# Patient Record
Sex: Female | Born: 1960 | Hispanic: No | Marital: Married | Smoking: Never smoker
Health system: Southern US, Community
[De-identification: ages and names within clinical notes are randomized; demographics above are authoritative.]

## PROBLEM LIST (undated history)

## (undated) DIAGNOSIS — E119 Type 2 diabetes mellitus without complications: Secondary | ICD-10-CM

## (undated) DIAGNOSIS — Z8042 Family history of malignant neoplasm of prostate: Secondary | ICD-10-CM

## (undated) DIAGNOSIS — Z800 Family history of malignant neoplasm of digestive organs: Secondary | ICD-10-CM

## (undated) DIAGNOSIS — Z86018 Personal history of other benign neoplasm: Secondary | ICD-10-CM

## (undated) DIAGNOSIS — G4730 Sleep apnea, unspecified: Secondary | ICD-10-CM

## (undated) DIAGNOSIS — F419 Anxiety disorder, unspecified: Secondary | ICD-10-CM

## (undated) DIAGNOSIS — Z8041 Family history of malignant neoplasm of ovary: Secondary | ICD-10-CM

## (undated) DIAGNOSIS — F32A Depression, unspecified: Secondary | ICD-10-CM

## (undated) HISTORY — DX: Family history of malignant neoplasm of prostate: Z80.42

## (undated) HISTORY — PX: ENDOMETRIAL ABLATION: SHX621

## (undated) HISTORY — DX: Depression, unspecified: F32.A

## (undated) HISTORY — PX: SKIN GRAFT: SHX250

## (undated) HISTORY — PX: TUMOR REMOVAL: SHX12

## (undated) HISTORY — DX: Anxiety disorder, unspecified: F41.9

## (undated) HISTORY — PX: TUBAL LIGATION: SHX77

## (undated) HISTORY — DX: Personal history of other benign neoplasm: Z86.018

## (undated) HISTORY — DX: Sleep apnea, unspecified: G47.30

## (undated) HISTORY — DX: Family history of malignant neoplasm of ovary: Z80.41

## (undated) HISTORY — PX: NASAL SINUS SURGERY: SHX719

## (undated) HISTORY — PX: COLONOSCOPY: SHX174

## (undated) HISTORY — DX: Type 2 diabetes mellitus without complications: E11.9

## (undated) HISTORY — DX: Family history of malignant neoplasm of digestive organs: Z80.0

## (undated) HISTORY — PX: GYNECOLOGIC CRYOSURGERY: SHX857

---

## 2005-02-07 HISTORY — PX: COLONOSCOPY: SHX174

## 2015-01-20 DIAGNOSIS — L905 Scar conditions and fibrosis of skin: Secondary | ICD-10-CM | POA: Insufficient documentation

## 2015-04-20 DIAGNOSIS — R7302 Impaired glucose tolerance (oral): Secondary | ICD-10-CM | POA: Insufficient documentation

## 2015-04-20 DIAGNOSIS — F3340 Major depressive disorder, recurrent, in remission, unspecified: Secondary | ICD-10-CM | POA: Insufficient documentation

## 2015-04-20 DIAGNOSIS — E559 Vitamin D deficiency, unspecified: Secondary | ICD-10-CM | POA: Insufficient documentation

## 2015-04-20 DIAGNOSIS — E782 Mixed hyperlipidemia: Secondary | ICD-10-CM | POA: Insufficient documentation

## 2015-04-21 DIAGNOSIS — Z79899 Other long term (current) drug therapy: Secondary | ICD-10-CM | POA: Insufficient documentation

## 2015-05-05 DIAGNOSIS — M5136 Other intervertebral disc degeneration, lumbar region: Secondary | ICD-10-CM | POA: Insufficient documentation

## 2015-09-18 DIAGNOSIS — F5102 Adjustment insomnia: Secondary | ICD-10-CM | POA: Insufficient documentation

## 2016-05-20 ENCOUNTER — Ambulatory Visit (INDEPENDENT_AMBULATORY_CARE_PROVIDER_SITE_OTHER): Payer: 59

## 2016-05-20 ENCOUNTER — Encounter: Payer: Self-pay | Admitting: Sports Medicine

## 2016-05-20 ENCOUNTER — Ambulatory Visit (INDEPENDENT_AMBULATORY_CARE_PROVIDER_SITE_OTHER): Payer: 59 | Admitting: Sports Medicine

## 2016-05-20 VITALS — BP 114/60 | HR 86 | Resp 18

## 2016-05-20 DIAGNOSIS — M7660 Achilles tendinitis, unspecified leg: Secondary | ICD-10-CM | POA: Diagnosis not present

## 2016-05-20 DIAGNOSIS — M7662 Achilles tendinitis, left leg: Secondary | ICD-10-CM

## 2016-05-20 DIAGNOSIS — R52 Pain, unspecified: Secondary | ICD-10-CM

## 2016-05-20 DIAGNOSIS — M7661 Achilles tendinitis, right leg: Secondary | ICD-10-CM

## 2016-05-20 MED ORDER — METHYLPREDNISOLONE 4 MG PO TBPK: ORAL_TABLET | ORAL | 0 refills | Status: DC

## 2016-05-20 NOTE — Progress Notes (Signed)
Subjective: Nancy Lynch is a 56 y.o. female patient who presents to office for evaluation of right and left heel pain. Patient complains of progressive pain especially over the last year in both heels at the Achilles. Reports pain keeps her up at night, sharp shooting swelling hurts now all the time with inability to sleep. Admits to also problems with her knees, hips and back. Follow orthopedic doctor who requested that she see a foot doctor. Patient has tried tramadol, meloxicam, gabapentin, ibuprofen with no relief in symptoms. Patient denies any other pedal complaints.   There are no active problems to display for this patient.   No current outpatient prescriptions on file prior to visit.   No current facility-administered medications on file prior to visit.     No Known Allergies  Objective:  General: Alert and oriented x3 in no acute distress  Dermatology: No open lesions bilateral lower extremities, no webspace macerations, no ecchymosis bilateral, all nails x 10 are well manicured.  Vascular: Dorsalis Pedis and Posterior Tibial pedal pulses 1/4, Capillary Fill Time 3 seconds, + pedal hair growth bilateral, no edema bilateral lower extremities, Temperature gradient within normal limits.  Neurology: Gross sensation intact via light touch bilateral, Protective sensation intact  with Semmes Weinstein Monofilament to all pedal sites, Position sense intact, vibratory intact bilateral, Deep tendon reflexes within normal limits bilateral, No babinski sign present bilateral. - Tinels sign right/left foot.   Musculoskeletal: Mild tenderness with palpation at insertion of the Achilles Bilateral, there is calcaneal exostosis with mild soft tissue present and decreased ankle rom with knee extending  vs flexed resembling gastroc equnius bilateral, The achilles tendon feels intact with no nodularity or palpable dell, Thompson sign negative, Subtalar and midtarsal joint range of motion is within  normal limits, there is no 1st ray hypermobility or forefoot deformity noted bilateral.   Gait: Antalgic gait with increased heel off right>left.   Xrays  Right and Left Foot    Impression: Normal osseous mineralization. Joint spaces preserved. No fracture/dislocation/boney destruction. Calcaneal spur present. Kager's triangle intact with no obliteration. No soft tissue abnormalities or radiopaque foreign bodies.   Assessment and Plan: Problem List Items Addressed This Visit    None    Visit Diagnoses    Achilles tendonitis, bilateral    -  Primary   Relevant Medications   methylPREDNISolone (MEDROL DOSEPAK) 4 MG TBPK tablet   Pain       Relevant Orders   DG Foot Complete Right   DG Foot Complete Left      -Complete examination performed -Xrays reviewed -Discussed treatement optionsFor insertional Achilles tendinitis -After oral consent and aseptic prep, injected a mixture containing 1 ml of 2%  plain lidocaine, 1 ml 0.5% plain marcaine, 0.5 ml of kenalog 10 and 0.5 ml of dexamethasone phosphate into right and left Achilles insertion without violation of the tendon without complication. Post-injection care discussed with patient.  -Rx Medrol dose pack heel lifts, gentle stretching -Dispensed night splint for right and left foot as instructed -Recommend icing at the end of the day -No improvement will consider MRI/PT/EPAT -Patient to return to office 1 month or sooner if condition worsens.  Asencion Islam, DPM

## 2016-05-20 NOTE — Progress Notes (Signed)
   Subjective:    Patient ID: Nancy Lynch, female    DOB: 13-Nov-1960, 56 y.o.   MRN: [id number]  HPI   I am having some foot pain and they do burn and throb and have a sharp pain at times and sore and tender and hurts on the bottom and up the back and I am a borderline diabetic   Review of Systems  All other systems reviewed and are negative.      Objective:   Physical Exam        Assessment & Plan:

## 2016-05-20 NOTE — Patient Instructions (Signed)
Achilles Tendinitis  Achilles tendinitis is inflammation of the tough, cord-like band that attaches the lower leg muscles to the heel bone (Achilles tendon). This is usually caused by overusing the tendon and the ankle joint.  Achilles tendinitis usually gets better over time with treatment and caring for yourself at home. It can take weeks or months to heal completely.  What are the causes?  This condition may be caused by:  · A sudden increase in exercise or activity, such as running.  · Doing the same exercises or activities (such as jumping) over and over.  · Not warming up calf muscles before exercising.  · Exercising in shoes that are worn out or not made for exercise.  · Having arthritis or a bone growth (spur) on the back of the heel bone. This can rub against the tendon and hurt it.  · Age-related wear and tear. Tendons become less flexible with age and more likely to be injured.    What are the signs or symptoms?  Common symptoms of this condition include:  · Pain in the Achilles tendon or in the back of the leg, just above the heel. The pain usually gets worse with exercise.  · Stiffness or soreness in the back of the leg, especially in the morning.  · Swelling of the skin over the Achilles tendon.  · Thickening of the tendon.  · Bone spurs at the bottom of the Achilles tendon, near the heel.  · Trouble standing on tiptoe.    How is this diagnosed?  This condition is diagnosed based on your symptoms and a physical exam. You may have tests, including:  · X-rays.  · MRI.    How is this treated?  The goal of treatment is to relieve symptoms and help your injury heal. Treatment may include:  · Decreasing or stopping activities that caused the tendinitis. This may mean switching to low-impact exercises like biking or swimming.  · Icing the injured area.  · Doing physical therapy, including strengthening and stretching exercises.  · NSAIDs to help relieve pain and swelling.  · Using supportive shoes, wraps,  heel lifts, or a walking boot (air cast).  · Surgery. This may be done if your symptoms do not improve after 6 months.  · Using high-energy shock wave impulses to stimulate the healing process (extracorporeal shock wave therapy). This is rare.  · Injection of medicines to help relieve inflammation (corticosteroids). This is rare.    Follow these instructions at home:  If you have an air cast:   · Wear the cast as told by your health care provider. Remove it only as told by your health care provider.  · Loosen the cast if your toes tingle, become numb, or turn cold and blue.  Activity   · Gradually return to your normal activities once your health care provider approves. Do not do activities that cause pain.  ? Consider doing low-impact exercises, like cycling or swimming.  · If you have an air cast, ask your health care provider when it is safe for you to drive.  · If physical therapy was prescribed, do exercises as told by your health care provider or physical therapist.  Managing pain, stiffness, and swelling   · Raise (elevate) your foot above the level of your heart while you are sitting or lying down.  · Move your toes often to avoid stiffness and to lessen swelling.  · If directed, put ice on the injured area:  ?   Put ice in a plastic bag.  ? Place a towel between your skin and the bag.  ? Leave the ice on for 20 minutes, 2-3 times a day  General instructions   · If directed, wrap your foot with an elastic bandage or other wrap. This can help keep your tendon from moving too much while it heals. Your health care provider will show you how to wrap your foot correctly.  · Wear supportive shoes or heel lifts only as told by your health care provider.  · Take over-the-counter and prescription medicines only as told by your health care provider.  · Keep all follow-up visits as told by your health care provider. This is important.  Contact a health care provider if:  · You have symptoms that gets worse.  · You have  pain that does not get better with medicine.  · You develop new, unexplained symptoms.  · You develop warmth and swelling in your foot.  · You have a fever.  Get help right away if:  · You have a sudden popping sound or sensation in your Achilles tendon followed by severe pain.  · You cannot move your toes or foot.  · You cannot put any weight on your foot.  Summary  · Achilles tendinitis is inflammation of the tough, cord-like band that attaches the lower leg muscles to the heel bone (Achilles tendon).  · This condition is usually caused by overusing the tendon and the ankle joint. It can also be caused by arthritis or normal aging.  · The most common symptoms of this condition include pain, swelling, or stiffness in the Achilles tendon or in the back of the leg.  · This condition is usually treated with rest, NSAIDs, and physical therapy.  This information is not intended to replace advice given to you by your health care provider. Make sure you discuss any questions you have with your health care provider.  Document Released: 11/03/2004 Document Revised: 12/14/2015 Document Reviewed: 12/14/2015  Elsevier Interactive Patient Education © 2017 Elsevier Inc.

## 2016-06-17 ENCOUNTER — Ambulatory Visit (INDEPENDENT_AMBULATORY_CARE_PROVIDER_SITE_OTHER): Payer: 59 | Admitting: Sports Medicine

## 2016-06-17 DIAGNOSIS — M7661 Achilles tendinitis, right leg: Secondary | ICD-10-CM

## 2016-06-17 DIAGNOSIS — M722 Plantar fascial fibromatosis: Secondary | ICD-10-CM

## 2016-06-17 DIAGNOSIS — B351 Tinea unguium: Secondary | ICD-10-CM | POA: Diagnosis not present

## 2016-06-17 DIAGNOSIS — M7662 Achilles tendinitis, left leg: Secondary | ICD-10-CM

## 2016-06-17 NOTE — Patient Instructions (Signed)
For tennis shoes recommend:  Brooks Beast Ascis New balance Saucony Can be purchased at [employer] or [employer]  Vionic  SAS Can be purchased at [employer] or [employer]   For work shoes recommend: Sketchers Work Timberland boots  Can be purchased at a variety of places or Shoe Market   For casual shoes recommend: Vionic  Can be purchased at [employer] or [employer]  

## 2016-06-18 MED ORDER — TRIAMCINOLONE ACETONIDE 10 MG/ML IJ SUSP: 10.0000 mg | Freq: Once | INTRAMUSCULAR | Status: DC

## 2016-06-18 NOTE — Progress Notes (Signed)
Subjective: Nancy SalkJanice Thumm is a 56 y.o. female patient seen today in office with complaint of painful thickened and discolored left 1st toenail. Patient is desiring treatment for nail changes; has not tried OTC topicals/Medication. Reports that nail is becoming difficult to manage because of the thickness. Patient reports that back of heels is better, injection helped however still feels it a little and now has pain at bottom of heels; Patient has no other pedal complaints at this time.   There are no active problems to display for this patient.   Current Outpatient Prescriptions on File Prior to Visit  Medication Sig Dispense Refill  . albuterol (PROAIR HFA) 108 (90 Base) MCG/ACT inhaler     . atorvastatin (LIPITOR) 40 MG tablet Take 40 mg by mouth.    . Cholecalciferol (D3-50) 50000 units capsule TAKE ONE CAPSULE BY MOUTH EVERY WEEK    . hydrOXYzine (VISTARIL) 25 MG capsule Take 1-2 tablets at night time for sleep.    . methylPREDNISolone (MEDROL DOSEPAK) 4 MG TBPK tablet Take 1st as instructed 21 tablet 0  . minocycline (MINOCIN,DYNACIN) 100 MG capsule     . tiZANidine (ZANAFLEX) 4 MG tablet Take 4 mg by mouth.     No current facility-administered medications on file prior to visit.     No Known Allergies  Objective: Physical Exam  General: Well developed, nourished, no acute distress, awake, alert and oriented x 3  Vascular: Dorsalis pedis artery 1/4 bilateral, Posterior tibial artery 1/4 bilateral, skin temperature warm to warm proximal to distal bilateral lower extremities, no varicosities, pedal hair present bilateral.  Neurological: Gross sensation present via light touch bilateral.   Dermatological: Skin is warm, dry, and supple bilateral, Left 1st toenail is thick, and discolored with mild subungal debris, no webspace macerations present bilateral, no open lesions present bilateral, no callus/corns/hyperkeratotic tissue present bilateral. No signs of infection  bilateral.  Musculoskeletal: Decreased pain with palpation to Achilles bilateral. Increased pain at plantar fascia bilateral. No pain with palpation at left 1st toenail. No pain with calf compression bilateral.  Assessment and Plan:  Problem List Items Addressed This Visit    None    Visit Diagnoses    Nail fungus    -  Primary   Relevant Orders   Fungus Culture with Smear   Achilles tendonitis, bilateral       Plantar fasciitis, bilateral       Relevant Medications   triamcinolone acetonide (KENALOG) 10 MG/ML injection 10 mg      -Examined patient -Discussed continued care for tendonitis and fasciitis -After oral consent and aseptic prep, injected a mixture containing 1 ml of 2%  plain lidocaine, 1 ml 0.5% plain marcaine, 0.5 ml of kenalog 10 and 0.5 ml of dexamethasone phosphate into left and right heel at plantar fascia without complication. Post-injection care discussed with patient.  -Rx topical pain cream from [facility] -Continue with icing, gentle stretching and shoes with heel support/lift -Continue with bilateral night splints  -Discussed treatment options for painful dystrophic nail at left 1st toe  -Fungal culture was obtained and [facility] -Patient to return in 4 weeks for follow up evaluation of tendonitis and fasciitiws and discussion of fungal culture results or sooner if symptoms worsen.  Asencion Islamitorya Indra Wolters, DPM

## 2016-06-20 ENCOUNTER — Telehealth: Payer: Self-pay | Admitting: *Deleted

## 2016-06-20 MED ORDER — NONFORMULARY OR COMPOUNDED ITEM: 2 refills | Status: DC

## 2016-06-20 NOTE — Telephone Encounter (Addendum)
-----   Message from Asencion Islamitorya Stover, [place] sent at 06/17/2016  5:32 PM EDT ----- Regarding: [facility] Topical anti-inflammatory cream for achilles tendonitis  -Dr. Kathie RhodesS. 06/20/2016-[facility].

## 2016-07-20 ENCOUNTER — Encounter: Payer: Self-pay | Admitting: Sports Medicine

## 2016-07-20 ENCOUNTER — Ambulatory Visit (INDEPENDENT_AMBULATORY_CARE_PROVIDER_SITE_OTHER): Payer: 59 | Admitting: Sports Medicine

## 2016-07-20 DIAGNOSIS — L089 Local infection of the skin and subcutaneous tissue, unspecified: Secondary | ICD-10-CM

## 2016-07-20 DIAGNOSIS — L603 Nail dystrophy: Secondary | ICD-10-CM | POA: Diagnosis not present

## 2016-07-20 DIAGNOSIS — A499 Bacterial infection, unspecified: Secondary | ICD-10-CM

## 2016-07-20 DIAGNOSIS — M7661 Achilles tendinitis, right leg: Secondary | ICD-10-CM

## 2016-07-20 DIAGNOSIS — B9689 Other specified bacterial agents as the cause of diseases classified elsewhere: Secondary | ICD-10-CM

## 2016-07-20 DIAGNOSIS — M7662 Achilles tendinitis, left leg: Secondary | ICD-10-CM | POA: Diagnosis not present

## 2016-07-20 DIAGNOSIS — M722 Plantar fascial fibromatosis: Secondary | ICD-10-CM | POA: Diagnosis not present

## 2016-07-20 MED ORDER — NEOMYCIN-POLYMYXIN-HC 3.5-10000-1 OP SUSP: OPHTHALMIC | 3 refills | Status: DC

## 2016-07-20 NOTE — Progress Notes (Signed)
Subjective: Nancy SalkJanice Lynch is a 56 y.o. female patient seen today in office for follow up of bilateral heel pain after injection and for discussion of fungal culture results. Patient states that her job won't allow her to wear tennis shoes without a note/accommodation paperwork completed. Patient also reports that she has started on a new herbal diet to help her lose weight. Patient has no other pedal complaints at this time.   Patient Active Problem List   Diagnosis Date Noted  . Adjustment insomnia 09/18/2015  . Lumbar degenerative disc disease 05/05/2015  . High risk medication use 04/21/2015  . Impaired glucose tolerance 04/20/2015  . Mixed hyperlipidemia 04/20/2015  . Recurrent major depressive disorder, in remission (HCC) 04/20/2015  . Vitamin D deficiency 04/20/2015  . 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 albuterol (PROAIR HFA) 108 (90 Base) MCG/ACT inhaler     . atorvastatin (LIPITOR) 40 MG tablet Take 40 mg by mouth.    . Cholecalciferol (D3-50) 50000 units capsule TAKE ONE CAPSULE BY MOUTH EVERY WEEK    . hydrOXYzine (VISTARIL) 25 MG capsule Take 1-2 tablets at night time for sleep.    . methylPREDNISolone (MEDROL DOSEPAK) 4 MG TBPK tablet Take 1st as instructed 21 tablet 0  . minocycline (MINOCIN,DYNACIN) 100 MG capsule     . NONFORMULARY OR COMPOUNDED ITEM [facility]:  Achilles Tendonitis Cream - Diclofenac 3%, Baclofen 2%, Bupivacaine 1%, Gabapentin 6%, Ibuprofen 3%, Pentoxifylline 3%, apply to affected area 3-4 times daily. 120 each 2  . tiZANidine (ZANAFLEX) 4 MG tablet Take 4 mg by mouth.     Current Facility-Administered Medications on File Prior to Visit  Medication Dose Route Frequency Provider Last Rate Last Dose  . triamcinolone acetonide (KENALOG) 10 MG/ML injection 10 mg  10 mg Other Once Asencion IslamStover, Yeslin Delio, DPM        No Known  Allergies  Objective: Physical Exam  General: Well developed, nourished, no acute distress, awake, alert and oriented x 3  Vascular: Dorsalis pedis artery 1/4 bilateral, Posterior tibial artery 1/4 bilateral, skin temperature warm to warm proximal to distal bilateral lower extremities, no varicosities, pedal hair present bilateral.  Neurological: Gross sensation present via light touch bilateral.   Dermatological: Skin is warm, dry, and supple bilateral, Left 1st toenail is thick, and discolored with mild subungal debris, there is dry blood underneath the right hallux nail, no webspace macerations present bilateral, no open lesions present bilateral, no callus/corns/hyperkeratotic tissue present bilateral. No signs of infection bilateral.  Musculoskeletal: Minimal pain with palpation to Achilles bilateral. Minimal pain at plantar fascia bilateral. No pain with palpation at 1st toenails. No pain with calf compression bilateral.  Assessment and Plan:  Problem List Items Addressed This Visit    None    Visit Diagnoses    Achilles tendonitis, bilateral    -  Primary   Plantar fasciitis, bilateral       Nail dystrophy       Relevant Medications   neomycin-polymyxin-hydrocortisone (CORTISPORIN) 3.5-10000-1 ophthalmic suspension   Bacterial infection of finger or toe       Relevant Medications   neomycin-polymyxin-hydrocortisone (CORTISPORIN) 3.5-10000-1 ophthalmic suspension      -Examined patient -Discussed continued care for tendonitis and fasciitis -No reinjection due to minimal symptoms and minimal additional relief -Continue with topical pain cream from [facility] -Continue with icing, gentle stretching and shoes with heel support/lifts -Continue with bilateral night  splints -Renewed temporary handicap -Completed duty accommodation paperwork for patient to allow tennis shoes at work to prevent a flare up in foot pain; Patient can continue with normal hours with no restrictions   -Discussed treatment options for + culture of subungal bacteria with onycholysis -Rx Cortisporin drops to use to nail -Patient to return in 6-8 weeks for follow up evaluation of tendonitis and fasciitis and nail check or sooner if symptoms worsen.  Asencion Islam, DPM

## 2016-09-02 ENCOUNTER — Ambulatory Visit (INDEPENDENT_AMBULATORY_CARE_PROVIDER_SITE_OTHER): Payer: 59 | Admitting: Sports Medicine

## 2016-09-02 ENCOUNTER — Encounter: Payer: Self-pay | Admitting: Sports Medicine

## 2016-09-02 DIAGNOSIS — R52 Pain, unspecified: Secondary | ICD-10-CM | POA: Diagnosis not present

## 2016-09-02 DIAGNOSIS — M7662 Achilles tendinitis, left leg: Secondary | ICD-10-CM | POA: Diagnosis not present

## 2016-09-02 DIAGNOSIS — M722 Plantar fascial fibromatosis: Secondary | ICD-10-CM | POA: Diagnosis not present

## 2016-09-02 DIAGNOSIS — M7661 Achilles tendinitis, right leg: Secondary | ICD-10-CM

## 2016-09-02 MED ORDER — MELOXICAM 15 MG PO TABS: 15.0000 mg | ORAL_TABLET | Freq: Every day | ORAL | 0 refills | Status: DC

## 2016-09-02 MED ORDER — METHYLPREDNISOLONE 4 MG PO TBPK: ORAL_TABLET | ORAL | 0 refills | Status: DC

## 2016-09-02 NOTE — Patient Instructions (Addendum)
For tennis shoes recommend:  Anne ShutterBrooks Beast Ascis New balance Saucony Can be purchased at [employer] or [employer] or Shoe Market  Vionic  SAS Can be purchased at [employer] or [employer]   For work shoes recommend: Pharmacologistketchers Work Engineer, materialsTimberland boots  Dansko  Can be purchased at a variety of places or Scientist, product/process developmenthoe Market   For casual shoes recommend: Vionic  Dansko Can be purchased at [employer] or [employer]
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
## 2016-09-23 ENCOUNTER — Other Ambulatory Visit: Payer: Self-pay | Admitting: Sports Medicine

## 2016-09-23 NOTE — Progress Notes (Signed)
A fax was received from [facility] for refill on Mobic as requested by patient. I will hold off on refill this medication until she is re-evaluated on 09-30-16 -Dr. Marylene Land

## 2016-09-30 ENCOUNTER — Ambulatory Visit (INDEPENDENT_AMBULATORY_CARE_PROVIDER_SITE_OTHER): Payer: 59 | Admitting: Sports Medicine

## 2016-09-30 DIAGNOSIS — M79671 Pain in right foot: Secondary | ICD-10-CM

## 2016-09-30 DIAGNOSIS — M722 Plantar fascial fibromatosis: Secondary | ICD-10-CM

## 2016-09-30 DIAGNOSIS — M7661 Achilles tendinitis, right leg: Secondary | ICD-10-CM

## 2016-09-30 DIAGNOSIS — M7662 Achilles tendinitis, left leg: Secondary | ICD-10-CM

## 2016-09-30 DIAGNOSIS — M79672 Pain in left foot: Secondary | ICD-10-CM

## 2016-09-30 MED ORDER — TRIAMCINOLONE ACETONIDE 10 MG/ML IJ SUSP: 10.0000 mg | Freq: Once | INTRAMUSCULAR | Status: DC

## 2016-09-30 NOTE — Progress Notes (Signed)
Subjective: Nancy Lynch is a 56 y.o. female patient seen today in office for follow up of bilateral heel pain L>R. States that it feels better but still a little pain on left at end of day. Patient has no other pedal complaints at this time.   Patient Active Problem List   Diagnosis Date Noted  . Adjustment insomnia 09/18/2015  . Lumbar degenerative disc disease 05/05/2015  . High risk medication use 04/21/2015  . Impaired glucose tolerance 04/20/2015  . Mixed hyperlipidemia 04/20/2015  . Recurrent major depressive disorder, in remission (HCC) 04/20/2015  . Vitamin D deficiency 04/20/2015  . 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 albuterol (PROAIR HFA) 108 (90 Base) MCG/ACT inhaler     . atorvastatin (LIPITOR) 40 MG tablet Take 40 mg by mouth.    . Cholecalciferol (D3-50) 50000 units capsule TAKE ONE CAPSULE BY MOUTH EVERY WEEK    . hydrOXYzine (VISTARIL) 25 MG capsule Take 1-2 tablets at night time for sleep.    . meloxicam (MOBIC) 15 MG tablet Take 1 tablet (15 mg total) by mouth daily. 30 tablet 0  . methylPREDNISolone (MEDROL DOSEPAK) 4 MG TBPK tablet Take as instructed 21 tablet 0  . minocycline (MINOCIN,DYNACIN) 100 MG capsule     . neomycin-polymyxin-hydrocortisone (CORTISPORIN) 3.5-10000-1 ophthalmic suspension 1-2 drops to toenail after using nail file each day 7.5 mL 3  . NONFORMULARY OR COMPOUNDED ITEM [facility]:  Achilles Tendonitis Cream - Diclofenac 3%, Baclofen 2%, Bupivacaine 1%, Gabapentin 6%, Ibuprofen 3%, Pentoxifylline 3%, apply to affected area 3-4 times daily. 120 each 2  . tiZANidine (ZANAFLEX) 4 MG tablet Take 4 mg by mouth.     Current Facility-Administered Medications on File Prior to Visit  Medication Dose Route Frequency Provider Last Rate Last Dose  . triamcinolone acetonide (KENALOG) 10 MG/ML injection 10 mg  10 mg Other Once Asencion Islam, DPM         No Known Allergies  Objective: Physical Exam  General: Well developed, nourished, no acute distress, awake, alert and oriented x 3  Vascular: Dorsalis pedis artery 1/4 bilateral, Posterior tibial artery 1/4 bilateral, skin temperature warm to warm proximal to distal bilateral lower extremities, no varicosities, pedal hair present bilateral.  Neurological: Gross sensation present via light touch bilateral.   Dermatological: Skin is warm, dry, and supple bilateral, Left 1st toenail is thick, and discolored with mild subungal debris, there is dry blood underneath the right hallux nail, Both slowly improving, no webspace macerations present bilateral, no open lesions present bilateral, no callus/corns/hyperkeratotic tissue present bilateral. No signs of infection bilateral.  Musculoskeletal: Minimal pain with palpation to Achilles bilateral. Minimal pain at plantar fascia on right, more intense on left. No pain with palpation at 1st toenails. No pain with calf compression bilateral.  Assessment and Plan:  Problem List Items Addressed This Visit    None    Visit Diagnoses    Plantar fasciitis, bilateral    -  Primary   Relevant Medications   triamcinolone acetonide (KENALOG) 10 MG/ML injection 10 mg (Start on 09/30/2016  7:15 PM)   Achilles tendonitis, bilateral       Bilateral foot pain          -Examined patient -Discussed continued care for tendonitis and fasciitis -After oral consent and aseptic prep, injected a mixture containing 1 ml of 2%  plain lidocaine, 1 ml 0.5% plain marcaine, 0.5 ml  of kenalog 10 and 0.5 ml of dexamethasone phosphate into left heel at plantar fascia without complication. Post-injection care discussed with patient.  -Rx [facility] at [facility] in [place] -Continue with topical pain cream from [facility] -Continue with icing, gentle stretching and shoes with heel support/lifts -Continue with bilateral night splints -Continue temporary handicap -Continue  with good supportive shoes -Continue with Cortisporin drops to use to nail -Patient to return in 6 weeks for follow up evaluation of tendonitis and fasciitis or sooner if symptoms worsen.  Asencion Islam, DPM

## 2016-10-11 ENCOUNTER — Ambulatory Visit: Payer: Self-pay

## 2016-10-11 ENCOUNTER — Other Ambulatory Visit: Payer: Self-pay | Admitting: Occupational Medicine

## 2016-10-11 DIAGNOSIS — M79671 Pain in right foot: Secondary | ICD-10-CM

## 2016-10-11 DIAGNOSIS — M25571 Pain in right ankle and joints of right foot: Secondary | ICD-10-CM

## 2016-10-21 ENCOUNTER — Other Ambulatory Visit: Payer: Self-pay | Admitting: Sports Medicine

## 2016-10-21 DIAGNOSIS — M7662 Achilles tendinitis, left leg: Secondary | ICD-10-CM

## 2016-10-21 DIAGNOSIS — R52 Pain, unspecified: Secondary | ICD-10-CM

## 2016-10-21 DIAGNOSIS — M7661 Achilles tendinitis, right leg: Secondary | ICD-10-CM

## 2016-10-21 DIAGNOSIS — M722 Plantar fascial fibromatosis: Secondary | ICD-10-CM

## 2016-10-21 NOTE — Telephone Encounter (Signed)
Pt needs an appt prior to future refills. 

## 2016-10-25 ENCOUNTER — Other Ambulatory Visit: Payer: Self-pay | Admitting: Occupational Medicine

## 2016-10-25 ENCOUNTER — Ambulatory Visit: Payer: Self-pay

## 2016-10-25 DIAGNOSIS — M79671 Pain in right foot: Secondary | ICD-10-CM

## 2016-11-11 ENCOUNTER — Encounter: Payer: Self-pay | Admitting: Sports Medicine

## 2016-11-11 ENCOUNTER — Ambulatory Visit (INDEPENDENT_AMBULATORY_CARE_PROVIDER_SITE_OTHER): Payer: 59 | Admitting: Sports Medicine

## 2016-11-11 DIAGNOSIS — M722 Plantar fascial fibromatosis: Secondary | ICD-10-CM | POA: Diagnosis not present

## 2016-11-11 DIAGNOSIS — M7662 Achilles tendinitis, left leg: Secondary | ICD-10-CM | POA: Diagnosis not present

## 2016-11-11 DIAGNOSIS — M79672 Pain in left foot: Secondary | ICD-10-CM | POA: Diagnosis not present

## 2016-11-11 DIAGNOSIS — M79671 Pain in right foot: Secondary | ICD-10-CM

## 2016-11-11 DIAGNOSIS — M7661 Achilles tendinitis, right leg: Secondary | ICD-10-CM

## 2016-11-11 MED ORDER — MELOXICAM 15 MG PO TABS: 15.0000 mg | ORAL_TABLET | Freq: Every day | ORAL | 0 refills | Status: DC

## 2016-11-11 NOTE — Progress Notes (Signed)
Subjective: Nancy Lynch is a 56 y.o. female patient seen today in office for follow up of bilateral heel pain. States that it feels better. Reports that therapy has been helping. Admits that she did sprain her ankle when walking around the car on 10/11/2016. States that this is getting better. She was given Vicodin, which helped.  Patient has no other pedal complaints at this time.   Patient Active Problem List   Diagnosis Date Noted  . Adjustment insomnia 09/18/2015  . Lumbar degenerative disc disease 05/05/2015  . High risk medication use 04/21/2015  . Impaired glucose tolerance 04/20/2015  . Mixed hyperlipidemia 04/20/2015  . Recurrent major depressive disorder, in remission (HCC) 04/20/2015  . Vitamin D deficiency 04/20/2015  . 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 albuterol (PROAIR HFA) 108 (90 Base) MCG/ACT inhaler     . atorvastatin (LIPITOR) 40 MG tablet Take 40 mg by mouth.    . Cholecalciferol (D3-50) 50000 units capsule TAKE ONE CAPSULE BY MOUTH EVERY WEEK    . hydrOXYzine (VISTARIL) 25 MG capsule Take 1-2 tablets at night time for sleep.    . methylPREDNISolone (MEDROL DOSEPAK) 4 MG TBPK tablet Take as instructed 21 tablet 0  . minocycline (MINOCIN,DYNACIN) 100 MG capsule     . neomycin-polymyxin-hydrocortisone (CORTISPORIN) 3.5-10000-1 ophthalmic suspension 1-2 drops to toenail after using nail file each day 7.5 mL 3  . NONFORMULARY OR COMPOUNDED ITEM [facility]:  Achilles Tendonitis Cream - Diclofenac 3%, Baclofen 2%, Bupivacaine 1%, Gabapentin 6%, Ibuprofen 3%, Pentoxifylline 3%, apply to affected area 3-4 times daily. 120 each 2  . tiZANidine (ZANAFLEX) 4 MG tablet Take 4 mg by mouth.     Current Facility-Administered Medications on File Prior to Visit  Medication Dose Route Frequency Provider Last Rate Last Dose  . triamcinolone acetonide (KENALOG) 10 MG/ML  injection 10 mg  10 mg Other Once [place], Omari Koslosky, DPM      . triamcinolone acetonide (KENALOG) 10 MG/ML injection 10 mg  10 mg Other Once Asencion Islam, DPM        No Known Allergies  Objective: Physical Exam  General: Well developed, nourished, no acute distress, awake, alert and oriented x 3  Vascular: Dorsalis pedis artery 1/4 bilateral, Posterior tibial artery 1/4 bilateral, skin temperature warm to warm proximal to distal bilateral lower extremities, no varicosities, pedal hair present bilateral.  Neurological: Gross sensation present via light touch bilateral.   Dermatological: Skin is warm, dry, and supple bilateral, Left 1st toenail is thick, and discolored with mild subungal debris, there is dry blood underneath the right hallux nail, Both slowly improving, no webspace macerations present bilateral, no open lesions present bilateral, no callus/corns/hyperkeratotic tissue present bilateral. No signs of infection bilateral.  Musculoskeletal: Minimal pain with palpation to Achilles bilateral. Minimal pain at plantar fascia , bilateral. No pain with palpation at 1st toenails. No pain to palpation to right lateral ankle. No pain with calf compression bilateral.  Assessment and Plan:  Problem List Items Addressed This Visit    None    Visit Diagnoses    Plantar fasciitis, bilateral    -  Primary   Relevant Medications   meloxicam (MOBIC) 15 MG tablet   Achilles tendonitis, bilateral       Relevant Medications   meloxicam (MOBIC) 15 MG tablet   Bilateral foot pain       Relevant Medications   meloxicam (MOBIC)  15 MG tablet     -Examined patient -Discussed continued care for tendonitis and fasciitis -Refilled meloxicam  -Continue with physical therapy until completed -Continue with topical pain cream from [facility] -Continue with icing, gentle stretching and shoes with heel support/lifts -Continue with bilateral night splints -Continue temporary handicap -Continue with  good supportive shoes -Continue with Cortisporin drops to use to nail -Patient to return as needed or sooner if symptoms worsen.  Asencion Islam, DPM

## 2017-03-17 ENCOUNTER — Other Ambulatory Visit: Payer: Self-pay | Admitting: Sports Medicine

## 2017-03-17 NOTE — Progress Notes (Signed)
Completed medical duty paperwork for patient. Patient may work normal shift with the allowance of wearing tennis shoes and getting up every hour as needed because of her history of foot pain. A copy of this paperwork was scanned to chart. -Dr. Marylene LandStover

## 2017-05-30 ENCOUNTER — Ambulatory Visit: Payer: Self-pay

## 2017-05-30 ENCOUNTER — Other Ambulatory Visit: Payer: Self-pay | Admitting: Family Medicine

## 2017-05-30 DIAGNOSIS — M25561 Pain in right knee: Secondary | ICD-10-CM

## 2017-06-13 ENCOUNTER — Ambulatory Visit: Payer: Self-pay

## 2017-06-13 ENCOUNTER — Other Ambulatory Visit: Payer: Self-pay | Admitting: Family Medicine

## 2017-06-13 DIAGNOSIS — M25551 Pain in right hip: Secondary | ICD-10-CM

## 2017-06-16 DIAGNOSIS — S99911A Unspecified injury of right ankle, initial encounter: Secondary | ICD-10-CM | POA: Insufficient documentation

## 2017-06-16 DIAGNOSIS — S86011A Strain of right Achilles tendon, initial encounter: Secondary | ICD-10-CM | POA: Insufficient documentation

## 2017-06-16 DIAGNOSIS — M25579 Pain in unspecified ankle and joints of unspecified foot: Secondary | ICD-10-CM | POA: Insufficient documentation

## 2017-07-13 DIAGNOSIS — M545 Low back pain, unspecified: Secondary | ICD-10-CM | POA: Insufficient documentation

## 2017-07-20 DIAGNOSIS — M7661 Achilles tendinitis, right leg: Secondary | ICD-10-CM | POA: Insufficient documentation

## 2017-08-04 ENCOUNTER — Telehealth: Payer: Self-pay | Admitting: Sports Medicine

## 2017-08-04 NOTE — Telephone Encounter (Signed)
Patient needs to get a new handicap sticker she will have her husband Arlis PortaJoseph Tegeler come today to pick it up.

## 2017-08-04 NOTE — Telephone Encounter (Signed)
After approving with Dr Marylene LandStover completed form for patients husband to pick up.

## 2017-08-11 DIAGNOSIS — M79671 Pain in right foot: Secondary | ICD-10-CM | POA: Insufficient documentation

## 2017-11-16 ENCOUNTER — Other Ambulatory Visit: Payer: Self-pay | Admitting: Sports Medicine

## 2017-11-16 DIAGNOSIS — M545 Low back pain, unspecified: Secondary | ICD-10-CM

## 2017-11-22 DIAGNOSIS — M7730 Calcaneal spur, unspecified foot: Secondary | ICD-10-CM | POA: Insufficient documentation

## 2017-11-22 DIAGNOSIS — M6701 Short Achilles tendon (acquired), right ankle: Secondary | ICD-10-CM | POA: Insufficient documentation

## 2017-11-29 ENCOUNTER — Ambulatory Visit
Admission: RE | Admit: 2017-11-29 | Discharge: 2017-11-29 | Disposition: A | Discharge status: Home / Self Care | Payer: Worker's Compensation | Source: Ambulatory Visit | Attending: Sports Medicine | Admitting: Sports Medicine

## 2017-11-29 DIAGNOSIS — M545 Low back pain, unspecified: Secondary | ICD-10-CM

## 2018-02-06 ENCOUNTER — Telehealth: Payer: Self-pay | Admitting: Sports Medicine

## 2018-02-07 NOTE — Telephone Encounter (Signed)
She will have to stop by office front desk for this

## 2018-03-07 ENCOUNTER — Encounter: Payer: Self-pay | Admitting: Obstetrics and Gynecology

## 2018-03-07 ENCOUNTER — Other Ambulatory Visit: Payer: Self-pay

## 2018-03-07 ENCOUNTER — Ambulatory Visit: Payer: BLUE CROSS/BLUE SHIELD | Admitting: Obstetrics and Gynecology

## 2018-03-07 VITALS — BP 138/78 | HR 88 | Ht 64.0 in | Wt 198.0 lb

## 2018-03-07 DIAGNOSIS — Z01419 Encounter for gynecological examination (general) (routine) without abnormal findings: Secondary | ICD-10-CM

## 2018-03-07 DIAGNOSIS — G8929 Other chronic pain: Secondary | ICD-10-CM

## 2018-03-07 DIAGNOSIS — Z124 Encounter for screening for malignant neoplasm of cervix: Secondary | ICD-10-CM

## 2018-03-07 DIAGNOSIS — R3915 Urgency of urination: Secondary | ICD-10-CM | POA: Diagnosis not present

## 2018-03-07 DIAGNOSIS — R350 Frequency of micturition: Secondary | ICD-10-CM | POA: Diagnosis not present

## 2018-03-07 DIAGNOSIS — M545 Low back pain, unspecified: Secondary | ICD-10-CM

## 2018-03-07 DIAGNOSIS — N950 Postmenopausal bleeding: Secondary | ICD-10-CM

## 2018-03-07 DIAGNOSIS — Z8041 Family history of malignant neoplasm of ovary: Secondary | ICD-10-CM

## 2018-03-07 DIAGNOSIS — R1030 Lower abdominal pain, unspecified: Secondary | ICD-10-CM

## 2018-03-07 NOTE — Progress Notes (Signed)
58 y.o. [id number] Married Caucasian female here for annual exam.  She had cryo surgery of her cervix many years ago. She had some brown vaginal discharge in 12/19. She c/o bloating. No bowel changes.  She had 3 rounds of antibiotics for a UTI, last treated in 12/19. Last time she was told she didn't have a UTI.  She continues to have urinary frequency and urgency, no dysuria. Can have slight discomfort externally with voiding.  Urine dip from 1/27: + blood, + WBC Urine culture from 01/07/18 was negative.  She c/o intermittent pain in her lower back for 6 months, worse in the last 1-2 month. Present most of the time in the last week. Pain is up to 8-10/10 in severity. It hurts so bad she can't focus. Sometimes it helps to move, or massage it.  She gets an occasional pain in her lower abdomen (either side). The abdominal pain is an aching pain that can come to go over the course of 30-60 minutes. Up to a 8/10 in severity.     No LMP recorded. Patient has had an ablation.          Sexually active: No.  The current method of family planning is tubal ligation, ablation.    Exercising: No.  The patient does not participate in regular exercise at present. Smoker:  no  Health Maintenance: Pap:  2019 WNL per patient History of abnormal Pap:  Yes, 1983 had cryosurgery MMG:  2019, being followed for something in her left breast BMD:   Never Colonoscopy: 2017 WNL per patient  TDaP:  UTD to patient Gardasil: N/A   reports that she has never smoked. She has never used smokeless tobacco. She reports that she does not drink alcohol or use drugs. She works in [employer], [employer]. 2 kids and one 80 year old grandchild.   Past Medical History:  Diagnosis Date  . History of uterine fibroid     Past Surgical History:  Procedure Laterality Date  . ENDOMETRIAL ABLATION    . GYNECOLOGIC CRYOSURGERY    . NASAL SINUS SURGERY    . SKIN GRAFT    . TUBAL LIGATION    . TUMOR REMOVAL     on thigh, benign per  patient    Current Outpatient Medications  Medication Sig Dispense Refill  . phenazopyridine (PYRIDIUM) 200 MG tablet Take 200 mg by mouth 3 (three) times daily as needed for pain.    Marland Kitchen albuterol (PROAIR HFA) 108 (90 Base) MCG/ACT inhaler      Current Facility-Administered Medications  Medication Dose Route Frequency Provider Last Rate Last Dose  . triamcinolone acetonide (KENALOG) 10 MG/ML injection 10 mg  10 mg Other Once Asencion Islam, DPM      . triamcinolone acetonide (KENALOG) 10 MG/ML injection 10 mg  10 mg Other Once Asencion Islam, DPM        Family History  Problem Relation Age of Onset  . COPD Mother   . Osteoporosis Mother   . Heart attack Father   . Ovarian cancer Sister   . Heart attack Brother   . Colon cancer Paternal Grandfather   Sister died of ovarian cancer at 88.   Review of Systems  Constitutional: Negative.   HENT: Negative.   Eyes: Negative.   Respiratory: Negative.   Cardiovascular: Negative.   Gastrointestinal: Positive for abdominal pain.  Endocrine: Negative.   Genitourinary:       Burning with intercourse  Musculoskeletal: Positive for back pain.  Skin: Negative.  Allergic/Immunologic: Negative.   Neurological: Negative.   Hematological: Negative.   Psychiatric/Behavioral: Negative.     Exam:   BP 138/78 (BP Location: Right Arm, Patient Position: Sitting, Cuff Size: Normal)   Pulse 88   Ht 5\' 4"  (1.626 m)   Wt 198 lb (89.8 kg)   BMI 33.99 kg/m   Weight change: @WEIGHTCHANGE @ Height:   Height: 5\' 4"  (162.6 cm)  Ht Readings from Last 3 Encounters:  03/07/18 5\' 4"  (1.626 m)    General appearance: alert, cooperative and appears stated age Head: Normocephalic, without obvious abnormality, atraumatic Neck: no adenopathy, supple, symmetrical, trachea midline and thyroid normal to inspection and palpation Lungs: clear to auscultation bilaterally Cardiovascular: regular rate and rhythm Breasts: normal appearance, no masses or  tenderness Abdomen: soft, non-tender; non distended,  no masses,  no organomegaly Extremities: extremities normal, atraumatic, no cyanosis or edema Skin: Skin color, texture, turgor normal. No rashes or lesions Lymph nodes: Cervical, supraclavicular, and axillary nodes normal. No abnormal inguinal nodes palpated Neurologic: Grossly normal   Pelvic: External genitalia:  no lesions              Urethra:  normal appearing urethra with no masses, tenderness or lesions              Bartholins and Skenes: normal                 Vagina: normal appearing vagina with normal color and discharge, no lesions              Cervix: no cervical motion tenderness and no lesions               Bimanual Exam:  Uterus:  normal size, contour, position, consistency, mobility, non-tender and anteverted              Adnexa: no mass, fullness, tenderness               Rectovaginal: Confirms               Anus:  normal sphincter tone, no lesions  Bladder: not tender  Pelvic floor: not tender  Chaperone was present for exam.  A:  Well Woman with normal exam  Severe lower/mid back pain, suspect MS pain  PMP spotting  Urinary frequency and urgency  Intermittent abdominal pain  Bloating   FH of ovarian cancer, sister died at 32, didn't have genetic testing  P:   Pap with hpv  Return for gyn ultrasound  Send urine for Ua, C&S  Discussed breast self exam  Discussed calcium and vit D intake  She will do her labs with her primary  Discussed PT for her back, information on back pain given  Discussed the option of seeing a genetic counselor

## 2018-03-07 NOTE — Patient Instructions (Addendum)
EXERCISE AND DIET:  We recommended that you start or continue a regular exercise program for good health. Regular exercise means any activity that makes your heart beat faster and makes you sweat.  We recommend exercising at least 30 minutes per day at least 3 days a week, preferably 4 or 5.  We also recommend a diet low in fat and sugar.  Inactivity, poor dietary choices and obesity can cause diabetes, heart attack, stroke, and kidney damage, among others.    ALCOHOL AND SMOKING:  Women should limit their alcohol intake to no more than 7 drinks/beers/glasses of wine (combined, not each!) per week. Moderation of alcohol intake to this level decreases your risk of breast cancer and liver damage. And of course, no recreational drugs are part of a healthy lifestyle.  And absolutely no smoking or even second hand smoke. Most people know smoking can cause heart and lung diseases, but did you know it also contributes to weakening of your bones? Aging of your skin?  Yellowing of your teeth and nails?  CALCIUM AND VITAMIN D:  Adequate intake of calcium and Vitamin D are recommended.  The recommendations for exact amounts of these supplements seem to change often, but generally speaking 1,200 mg of calcium (between diet and supplement) and 800 units of Vitamin D per day seems prudent. Certain women may benefit from higher intake of Vitamin D.  If you are among these women, your doctor will have told you during your visit.    PAP SMEARS:  Pap smears, to check for cervical cancer or precancers,  have traditionally been done yearly, although recent scientific advances have shown that most women can have pap smears less often.  However, every woman still should have a physical exam from her gynecologist every year. It will include a breast check, inspection of the vulva and vagina to check for abnormal growths or skin changes, a visual exam of the cervix, and then an exam to evaluate the size and shape of the uterus and  ovaries.  And after 57 years of age, a rectal exam is indicated to check for rectal cancers. We will also provide age appropriate advice regarding health maintenance, like when you should have certain vaccines, screening for sexually transmitted diseases, bone density testing, colonoscopy, mammograms, etc.   MAMMOGRAMS:  All women over 40 years old should have a yearly mammogram. Many facilities now offer a "3D" mammogram, which may cost around $50 extra out of pocket. If possible,  we recommend you accept the option to have the 3D mammogram performed.  It both reduces the number of women who will be called back for extra views which then turn out to be normal, and it is better than the routine mammogram at detecting truly abnormal areas.    COLON CANCER SCREENING: Now recommend starting at age 45. At this time colonoscopy is not covered for routine screening until 50. There are take home tests that can be done between 45-49.   COLONOSCOPY:  Colonoscopy to screen for colon cancer is recommended for all women at age 50.  We know, you hate the idea of the prep.  We agree, BUT, having colon cancer and not knowing it is worse!!  Colon cancer so often starts as a polyp that can be seen and removed at colonscopy, which can quite literally save your life!  And if your first colonoscopy is normal and you have no family history of colon cancer, most women don't have to have it again for   10 years.  Once every ten years, you can do something that may end up saving your life, right?  We will be happy to help you get it scheduled when you are ready.  Be sure to check your insurance coverage so you understand how much it will cost.  It may be covered as a preventative service at no cost, but you should check your particular policy.      Breast Self-Awareness Breast self-awareness means being familiar with how your breasts look and feel. It involves checking your breasts regularly and reporting any changes to your  health care provider. Practicing breast self-awareness is important. A change in your breasts can be a sign of a serious medical problem. Being familiar with how your breasts look and feel allows you to find any problems early, when treatment is more likely to be successful. All women should practice breast self-awareness, including women who have had breast implants. How to do a breast self-exam One way to learn what is normal for your breasts and whether your breasts are changing is to do a breast self-exam. To do a breast self-exam: Look for Changes  1. Remove all the clothing above your waist. 2. Stand in front of a mirror in a room with good lighting. 3. Put your hands on your hips. 4. Push your hands firmly downward. 5. Compare your breasts in the mirror. Look for differences between them (asymmetry), such as: ? Differences in shape. ? Differences in size. ? Puckers, dips, and bumps in one breast and not the other. 6. Look at each breast for changes in your skin, such as: ? Redness. ? Scaly areas. 7. Look for changes in your nipples, such as: ? Discharge. ? Bleeding. ? Dimpling. ? Redness. ? A change in position. Feel for Changes Carefully feel your breasts for lumps and changes. It is best to do this while lying on your back on the floor and again while sitting or standing in the shower or tub with soapy water on your skin. Feel each breast in the following way:  Place the arm on the side of the breast you are examining above your head.  Feel your breast with the other hand.  Start in the nipple area and make  inch (2 cm) overlapping circles to feel your breast. Use the pads of your three middle fingers to do this. Apply light pressure, then medium pressure, then firm pressure. The light pressure will allow you to feel the tissue closest to the skin. The medium pressure will allow you to feel the tissue that is a little deeper. The firm pressure will allow you to feel the tissue  close to the ribs.  Continue the overlapping circles, moving downward over the breast until you feel your ribs below your breast.  Move one finger-width toward the center of the body. Continue to use the  inch (2 cm) overlapping circles to feel your breast as you move slowly up toward your collarbone.  Continue the up and down exam using all three pressures until you reach your armpit.  Write Down What You Find  Write down what is normal for each breast and any changes that you find. Keep a written record with breast changes or normal findings for each breast. By writing this information down, you do not need to depend only on memory for size, tenderness, or location. Write down where you are in your menstrual cycle, if you are still menstruating. If you are having trouble noticing differences   in your breasts, do not get discouraged. With time you will become more familiar with the variations in your breasts and more comfortable with the exam. How often should I examine my breasts? Examine your breasts every month. If you are breastfeeding, the best time to examine your breasts is after a feeding or after using a breast pump. If you menstruate, the best time to examine your breasts is 5-7 days after your period is over. During your period, your breasts are lumpier, and it may be more difficult to notice changes. When should I see my health care provider? See your health care provider if you notice:  A change in shape or size of your breasts or nipples.  A change in the skin of your breast or nipples, such as a reddened or scaly area.  Unusual discharge from your nipples.  A lump or thick area that was not there before.  Pain in your breasts.  Anything that concerns you.   Acute Back Pain, Adult Acute back pain is sudden and usually short-lived. It is often caused by an injury to the muscles and tissues in the back. The injury may result from:  A muscle or ligament getting  overstretched or torn (strained). Ligaments are tissues that connect bones to each other. Lifting something improperly can cause a back strain.  Wear and tear (degeneration) of the spinal disks. Spinal disks are circular tissue that provides cushioning between the bones of the spine (vertebrae).  Twisting motions, such as while playing sports or doing yard work.  A hit to the back.  Arthritis. You may have a physical exam, lab tests, and imaging tests to find the cause of your pain. Acute back pain usually goes away with rest and home care. Follow these instructions at home: Managing pain, stiffness, and swelling  Take over-the-counter and prescription medicines only as told by your health care provider.  Your health care provider may recommend applying ice during the first 24-48 hours after your pain starts. To do this: ? Put ice in a plastic bag. ? Place a towel between your skin and the bag. ? Leave the ice on for 20 minutes, 2-3 times a day.  If directed, apply heat to the affected area as often as told by your health care provider. Use the heat source that your health care provider recommends, such as a moist heat pack or a heating pad. ? Place a towel between your skin and the heat source. ? Leave the heat on for 20-30 minutes. ? Remove the heat if your skin turns bright red. This is especially important if you are unable to feel pain, heat, or cold. You have a greater risk of getting burned. Activity   Do not stay in bed. Staying in bed for more than 1-2 days can delay your recovery.  Sit up and stand up straight. Avoid leaning forward when you sit, or hunching over when you stand. ? If you work at a desk, sit close to it so you do not need to lean over. Keep your chin tucked in. Keep your neck drawn back, and keep your elbows bent at a right angle. Your arms should look like the letter "L." ? Sit high and close to the steering wheel when you drive. Add lower back (lumbar)  support to your car seat, if needed.  Take short walks on even surfaces as soon as you are able. Try to increase the length of time you walk each day.  Do not  sit, drive, or stand in one place for more than 30 minutes at a time. Sitting or standing for long periods of time can put stress on your back.  Do not drive or use heavy machinery while taking prescription pain medicine.  Use proper lifting techniques. When you bend and lift, use positions that put less stress on your back: ? Paradis your knees. ? Keep the load close to your body. ? Avoid twisting.  Exercise regularly as told by your health care provider. Exercising helps your back heal faster and helps prevent back injuries by keeping muscles strong and flexible.  Work with a physical therapist to make a safe exercise program, as recommended by your health care provider. Do any exercises as told by your physical therapist. Lifestyle  Maintain a healthy weight. Extra weight puts stress on your back and makes it difficult to have good posture.  Avoid activities or situations that make you feel anxious or stressed. Stress and anxiety increase muscle tension and can make back pain worse. Learn ways to manage anxiety and stress, such as through exercise. General instructions  Sleep on a firm mattress in a comfortable position. Try lying on your side with your knees slightly bent. If you lie on your back, put a pillow under your knees.  Follow your treatment plan as told by your health care provider. This may include: ? Cognitive or behavioral therapy. ? Acupuncture or massage therapy. ? Meditation or yoga. Contact a health care provider if:  You have pain that is not relieved with rest or medicine.  You have increasing pain going down into your legs or buttocks.  Your pain does not improve after 2 weeks.  You have pain at night.  You lose weight without trying.  You have a fever or chills. Get help right away if:  You  develop new bowel or bladder control problems.  You have unusual weakness or numbness in your arms or legs.  You develop nausea or vomiting.  You develop abdominal pain.  You feel faint. Summary  Acute back pain is sudden and usually short-lived.  Use proper lifting techniques. When you bend and lift, use positions that put less stress on your back.  Take over-the-counter and prescription medicines and apply heat or ice as directed by your health care provider. This information is not intended to replace advice given to you by your health care provider. Make sure you discuss any questions you have with your health care provider. Document Released: 01/24/2005 Document Revised: 08/31/2017 Document Reviewed: 09/07/2016 Elsevier Interactive Patient Education  2019 [employer].

## 2018-03-08 ENCOUNTER — Other Ambulatory Visit (HOSPITAL_COMMUNITY)
Admission: RE | Admit: 2018-03-08 | Discharge: 2018-03-08 | Disposition: A | Discharge status: Home / Self Care | Payer: BLUE CROSS/BLUE SHIELD | Source: Ambulatory Visit | Attending: Obstetrics and Gynecology | Admitting: Obstetrics and Gynecology

## 2018-03-08 DIAGNOSIS — Z124 Encounter for screening for malignant neoplasm of cervix: Secondary | ICD-10-CM | POA: Diagnosis not present

## 2018-03-08 LAB — URINE CULTURE

## 2018-03-08 LAB — URINALYSIS, MICROSCOPIC ONLY: CASTS: NONE SEEN /LPF

## 2018-03-08 NOTE — Addendum Note (Signed)
Addended by: Tobi Bastos on: 03/08/2018 05:47 PM   Modules accepted: Orders

## 2018-03-12 LAB — CYTOLOGY - PAP
Diagnosis: NEGATIVE
HPV: NOT DETECTED

## 2018-03-13 ENCOUNTER — Other Ambulatory Visit: Payer: Self-pay

## 2018-03-13 ENCOUNTER — Ambulatory Visit (INDEPENDENT_AMBULATORY_CARE_PROVIDER_SITE_OTHER): Payer: BLUE CROSS/BLUE SHIELD

## 2018-03-13 ENCOUNTER — Encounter: Payer: Self-pay | Admitting: Obstetrics and Gynecology

## 2018-03-13 ENCOUNTER — Ambulatory Visit: Payer: BLUE CROSS/BLUE SHIELD | Admitting: Obstetrics and Gynecology

## 2018-03-13 VITALS — BP 120/68 | HR 76 | Wt 200.0 lb

## 2018-03-13 DIAGNOSIS — N950 Postmenopausal bleeding: Secondary | ICD-10-CM

## 2018-03-13 DIAGNOSIS — R300 Dysuria: Secondary | ICD-10-CM

## 2018-03-13 DIAGNOSIS — N3281 Overactive bladder: Secondary | ICD-10-CM | POA: Diagnosis not present

## 2018-03-13 DIAGNOSIS — Z8041 Family history of malignant neoplasm of ovary: Secondary | ICD-10-CM

## 2018-03-13 DIAGNOSIS — R1030 Lower abdominal pain, unspecified: Secondary | ICD-10-CM | POA: Diagnosis not present

## 2018-03-13 LAB — POCT URINALYSIS DIPSTICK
Bilirubin, UA: NEGATIVE
Blood, UA: POSITIVE
Glucose, UA: NEGATIVE
Ketones, UA: NEGATIVE
Nitrite, UA: NEGATIVE
Protein, UA: NEGATIVE
Spec Grav, UA: 1.01 (ref 1.010–1.025)
Urobilinogen, UA: 0.2 E.U./dL
pH, UA: 5 (ref 5.0–8.0)

## 2018-03-13 NOTE — Progress Notes (Signed)
GYNECOLOGY  VISIT   HPI: 58 y.o.   Married Unavailable Unavailable  female   [phone] with No LMP recorded. Patient has had an ablation. here for consult following PUS. The patient had some vaginal spotting in 12/19. At the time of her annual exam last week she c/o urinary frequency and urgency for 6-12 months. Negative urine culture. She drinks about 11 oz of coffee a day.   GYNECOLOGIC HISTORY: No LMP recorded. Patient has had an ablation. Contraception: Ablation Menopausal hormone therapy: None        OB History    Gravida  2   Para  2   Term  2   Preterm      AB      Living  2     SAB      TAB      Ectopic      Multiple      Live Births  2              Patient Active Problem List   Diagnosis Date Noted  . Adjustment insomnia 09/18/2015  . Lumbar degenerative disc disease 05/05/2015  . High risk medication use 04/21/2015  . Impaired glucose tolerance 04/20/2015  . Mixed hyperlipidemia 04/20/2015  . Recurrent major depressive disorder, in remission (HCC) 04/20/2015  . Vitamin D deficiency 04/20/2015  . 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 History of uterine fibroid     Past Surgical History:  Procedure Laterality Date  . ENDOMETRIAL ABLATION    . GYNECOLOGIC CRYOSURGERY    . NASAL SINUS SURGERY    . SKIN GRAFT    . TUBAL LIGATION    . TUMOR REMOVAL     on thigh, benign per patient    Current Outpatient Medications  Medication Sig Dispense Refill  . albuterol (PROAIR HFA) 108 (90 Base) MCG/ACT inhaler     . phenazopyridine (PYRIDIUM) 200 MG tablet Take 200 mg by mouth 3 (three) times daily as needed for pain.     Current Facility-Administered Medications  Medication Dose Route Frequency Provider Last Rate Last Dose  . triamcinolone acetonide (KENALOG) 10 MG/ML injection 10 mg  10 mg Other Once Asencion Islam, DPM      . triamcinolone acetonide (KENALOG) 10 MG/ML injection 10 mg  10 mg  Other Once Asencion Islam, DPM         ALLERGIES: Patient has no known allergies.  Family History  Problem Relation Age of Onset  . COPD Mother   . Osteoporosis Mother   . Heart attack Father   . Ovarian cancer Sister   . Heart attack Brother   . Colon cancer Paternal Grandfather     Social History   Socioeconomic History  . Marital status: Married    Spouse name: Not on file  . Number of children: Not on file  . Years of education: Not on file  . Highest education level: Not on file  Occupational History  . Not on file  Social Needs  . Financial resource strain: Not on file  . Food insecurity:    Worry: Not on file    Inability: Not on file  . Transportation needs:    Medical: Not on file    Non-medical: Not on file  Tobacco Use  . Smoking status: Never Smoker  . Smokeless tobacco: Never Used  Substance and Sexual Activity  . Alcohol use: Never    Frequency: Never  .  Drug use: Never  . Sexual activity: Not Currently    Birth control/protection: Surgical    Comment: ablation, tubal ligation  Lifestyle  . Physical activity:    Days per week: Not on file    Minutes per session: Not on file  . Stress: Not on file  Relationships  . Social connections:    Talks on phone: Not on file    Gets together: Not on file    Attends religious service: Not on file    Active member of club or organization: Not on file    Attends meetings of clubs or organizations: Not on file    Relationship status: Not on file  . Intimate partner violence:    Fear of current or ex partner: Not on file    Emotionally abused: Not on file    Physically abused: Not on file    Forced sexual activity: Not on file  Other Topics Concern  . Not on file  Social History Narrative  . Not on file    Review of Systems  Constitutional:       Weight gain  HENT: Negative.   Eyes: Negative.   Respiratory: Negative.   Cardiovascular: Negative.   Gastrointestinal:       Bloating    Genitourinary: Positive for dysuria, frequency and urgency.       Pain with intercourse  Musculoskeletal: Negative.   Skin: Negative.   Neurological: Negative.   Endo/Heme/Allergies: Negative.   Psychiatric/Behavioral: Negative.     PHYSICAL EXAMINATION:    BP 120/68 (BP Location: Right Arm, Patient Position: Sitting, Cuff Size: Large)   Pulse 76   Wt 200 lb (90.7 kg)   BMI 34.33 kg/m     General appearance: alert, cooperative and appears stated age   ASSESSMENT Postmenopausal spotting in 12/19, thin lining on ultrasound OAB symptoms, negative urine culture. She does drink caffine every morning Crystals on micro ua last week, recommend she stay well hydrated.  Back pain    PLAN Call with any bleeding, would further evaluate Recommended she cut out the caffeine, if that isn't working can try PT, or medication If she wants the medication for OAB will call Recommended she f/u with her primary and PT for her back pain   An After Visit Summary was printed and given to the patient.  Over 15 minutes face to face time of which over 50% was spent in counseling.

## 2018-03-29 ENCOUNTER — Encounter: Payer: Self-pay | Admitting: Genetic Counselor

## 2018-03-29 ENCOUNTER — Telehealth: Payer: Self-pay | Admitting: Genetic Counselor

## 2018-03-29 NOTE — Telephone Encounter (Signed)
A genetic counseling appt has been scheduled for the pt to see Elmer Picker on 3/19 at 1:15pm. Letter mailed to the pt.

## 2018-04-16 ENCOUNTER — Telehealth: Payer: Self-pay | Admitting: Obstetrics and Gynecology

## 2018-04-16 NOTE — Telephone Encounter (Signed)
Routing forms to [employer] for completion, okay per Dr. Oscar La.

## 2018-04-16 NOTE — Telephone Encounter (Signed)
Patient stopped by the office and dropped off some forms she is requesting Dr. Oscar La complete for her work accommodations at work. She said Dr. Oscar La wrote a letter for her job stating that she has an overactive bladder but her work requires the more detailed form to be completed. Form to Dr. Oscar La for review.

## 2018-04-18 ENCOUNTER — Telehealth: Payer: Self-pay | Admitting: Genetic Counselor

## 2018-04-18 NOTE — Telephone Encounter (Signed)
Pt cld to reschedule appt w/Karen Lowell Guitar on 4/29 at 2pm.

## 2018-04-26 ENCOUNTER — Other Ambulatory Visit: Payer: Self-pay

## 2018-04-26 ENCOUNTER — Telehealth: Payer: Self-pay | Admitting: Obstetrics and Gynecology

## 2018-04-26 NOTE — Telephone Encounter (Signed)
Patient returned call to Suzy. °

## 2018-04-26 NOTE — Telephone Encounter (Signed)
Nancy Lynch spoke with patient and confirmed where to fax accommodation forms. Forms have been faxed, as requested by the patient, to [employer] at fax number [phone] and to patients supervisor, Nancy Lynch at fax number [phone].  We have received fax confirmation that both fax transmittals were successful. Will close encounter

## 2018-04-26 NOTE — Telephone Encounter (Signed)
Call placed to patient to advised work accommodation forms are completed. Upon return call will need to obtain information on where to send forms need to be submitted

## 2018-04-26 NOTE — Telephone Encounter (Signed)
See previous phone note. Work accommodation forms have been forwarded to Dr Oscar La to review and sign

## 2018-05-04 ENCOUNTER — Telehealth: Payer: Self-pay | Admitting: *Deleted

## 2018-05-04 NOTE — Telephone Encounter (Signed)
Call back to patient. Reviewed need for bathroom breaks in order to complete accommodations form. Sending to Dr Oscar La for review and signature.

## 2018-05-04 NOTE — Telephone Encounter (Signed)
Patient returning call to Sally. °

## 2018-05-04 NOTE — Telephone Encounter (Signed)
Call to patient. Per ROI, can leave message on voice mail. Left message to call back regarding additional forms received for completion of ADA.

## 2018-06-06 ENCOUNTER — Encounter: Payer: Self-pay | Admitting: Genetic Counselor

## 2018-06-06 ENCOUNTER — Other Ambulatory Visit: Payer: Self-pay

## 2018-06-25 ENCOUNTER — Encounter: Payer: Self-pay | Admitting: Genetic Counselor

## 2018-07-25 ENCOUNTER — Telehealth: Payer: Self-pay | Admitting: Licensed Clinical Social Worker

## 2018-07-25 NOTE — Telephone Encounter (Signed)
Called patient regarding upcoming [facility] appointment, patient would prefer this to be a walk-in visit. Keep labs. °

## 2018-07-27 ENCOUNTER — Telehealth: Payer: Self-pay

## 2018-07-27 NOTE — Telephone Encounter (Signed)
Called pt  and left a vm about pre-screening questions for appt 6/22.

## 2018-07-30 ENCOUNTER — Inpatient Hospital Stay: Payer: BC Managed Care – PPO

## 2018-07-30 ENCOUNTER — Other Ambulatory Visit: Payer: Self-pay

## 2018-07-30 ENCOUNTER — Inpatient Hospital Stay: Payer: BC Managed Care – PPO | Attending: Genetic Counselor | Admitting: Licensed Clinical Social Worker

## 2018-07-30 ENCOUNTER — Encounter: Payer: Self-pay | Admitting: Licensed Clinical Social Worker

## 2018-07-30 DIAGNOSIS — Z800 Family history of malignant neoplasm of digestive organs: Secondary | ICD-10-CM

## 2018-07-30 DIAGNOSIS — Z8042 Family history of malignant neoplasm of prostate: Secondary | ICD-10-CM

## 2018-07-30 DIAGNOSIS — Z8041 Family history of malignant neoplasm of ovary: Secondary | ICD-10-CM

## 2018-07-30 NOTE — Progress Notes (Signed)
REFERRING PROVIDER: Salvadore Dom, [address],  [place] [address]  PRIMARY PROVIDER:  System, Pcp Not In  PRIMARY REASON FOR VISIT:  1. Family history of ovarian cancer   2. Family history of colon cancer   3. Family history of prostate cancer      HISTORY OF PRESENT ILLNESS:   Nancy Lynch, a 58 y.o. female, was seen for a [place] cancer genetics consultation at the request of Dr. Talbert Nan due to a family history of cancer.  Nancy Lynch presents to clinic today to discuss the possibility of a hereditary predisposition to cancer, genetic testing, and to further clarify her future cancer risks, as well as potential cancer risks for family members.   Nancy Lynch is a 58 y.o. female with no personal history of cancer.    CANCER HISTORY:  Oncology History   No history exists.    RISK FACTORS:  Menarche was at age 79.  First live birth at age 13.  OCP use for approximately 2 years.  Ovaries intact: yes.  Hysterectomy: no.  Menopausal status: postmenopausal.  HRT use: 0 years. Colonoscopy: yes; normal. Mammogram within the last year: yes.  Past Medical History:  Diagnosis Date  . Family history of colon cancer   . Family history of ovarian cancer   . Family history of prostate cancer   . History of uterine fibroid     Past Surgical History:  Procedure Laterality Date  . ENDOMETRIAL ABLATION    . GYNECOLOGIC CRYOSURGERY    . NASAL SINUS SURGERY    . SKIN GRAFT    . TUBAL LIGATION    . TUMOR REMOVAL     on thigh, benign per patient    Social History   Socioeconomic History  . Marital status: Married    Spouse name: Not on file  . Number of children: Not on file  . Years of education: Not on file  . Highest education level: Not on file  Occupational History  . Not on file  Social Needs  . Financial resource strain: Not on file  . Food insecurity    Worry: Not on file    Inability: Not on file  . Transportation needs     Medical: Not on file    Non-medical: Not on file  Tobacco Use  . Smoking status: Never Smoker  . Smokeless tobacco: Never Used  Substance and Sexual Activity  . Alcohol use: Never    Frequency: Never  . Drug use: Never  . Sexual activity: Not Currently    Birth control/protection: Surgical    Comment: ablation, tubal ligation  Lifestyle  . Physical activity    Days per week: Not on file    Minutes per session: Not on file  . Stress: Not on file  Relationships  . Social Herbalist on phone: Not on file    Gets together: Not on file    Attends religious service: Not on file    Active member of club or organization: Not on file    Attends meetings of clubs or organizations: Not on file    Relationship status: Not on file  Other Topics Concern  . Not on file  Social History Narrative  . Not on file     FAMILY HISTORY:  We obtained a detailed, 4-generation family history.  Significant diagnoses are listed below: Family History  Problem Relation Age of Onset  . COPD Mother   .  Osteoporosis Mother   . Heart attack Father   . Ovarian cancer Sister   . Heart attack Brother   . Colon cancer Paternal Grandfather    Nancy Lynch has 2 sons, ages 41 and 63, no history of cancer. She had 2 brothers and 2 sisters. One of her sisters had ovarian cancer diagnosed in her 13s and died at 58. One of her brothers is currently going through cancer treatment. She states he had back cancer, sinus cancer, and now prostate cancer. No cancers for her nieces/nephews.  Nancy Lynch mother died at 1. The patient had 2 maternal aunts, 2 maternal uncles, no cancers. No known cancers in cousins, limited info about these individuals. Patient's maternal grandmother died in her late 67s. Her maternal grandfather had cancer but she is unsure the type, possible prostate.  Nancy Lynch father died at 54. Patient had 3 paternal aunts, 2 paternal uncles, no cancers. No known cancers in paternal  cousins. Her paternal grandfather had colon cancer but she is unsure age of diagnosis or death. Her paternal grandmother died at 74.   Nancy Lynch is unaware of previous family history of genetic testing for hereditary cancer risks. There is no reported Ashkenazi Jewish ancestry. There is no known consanguinity.  GENETIC COUNSELING ASSESSMENT: Ms. Briere is a 58 y.o. female with a family history which is somewhat suggestive of a hereditary cancer syndrome and predisposition to cancer. We, therefore, discussed and recommended the following at today's visit.   DISCUSSION: We discussed that 10 - 25% of ovarian cancer is hereditary, with most cases associated with the BRCA1/BRCA2 genes.  There are other genes that can be associated with hereditary ovarian cancer syndromes. Sometimes ovarian cancer and prostate cancer/colon cancer can be part of the same cancer syndrome, other times these cancers are not. We reviewed the characteristics, features and inheritance patterns of hereditary cancer syndromes. We also discussed genetic testing, including the appropriate family members to test, the process of testing, insurance coverage and turn-around-time for results. We discussed the implications of a negative, positive and/or variant of uncertain significant result. We recommended Nancy Lynch pursue genetic testing for the Common Hereditary Cancers gene panel.   The Common Hereditary Cancers Panel offered by [facility] includes sequencing and/or deletion duplication testing of the following 48 genes: APC, ATM, AXIN2, BARD1, BMPR1A, BRCA1, BRCA2, BRIP1, CDH1, CDKN2A (p14ARF), CDKN2A (p16INK4a), CKD4, CHEK2, CTNNA1, DICER1, EPCAM (Deletion/duplication testing only), GREM1 (promoter region deletion/duplication testing only), KIT, MEN1, MLH1, MSH2, MSH3, MSH6, MUTYH, NBN, NF1, NHTL1, PALB2, PDGFRA, PMS2, POLD1, POLE, PTEN, RAD50, RAD51C, RAD51D, RNF43, SDHB, SDHC, SDHD, SMAD4, SMARCA4. STK11, TP53, TSC1, TSC2, and VHL.  The  following genes were evaluated for sequence changes only: SDHA and HOXB13 c.251G>A variant only.  Based on Nancy Lynch's family history of cancer, she meets medical criteria for genetic testing. Despite that she meets criteria, she may still have an out of pocket cost. The lab will notify her of an OOP if any.  PLAN: After considering the risks, benefits, and limitations, Ms. Daise provided informed consent to pursue genetic testing and the blood sample was sent to [facility] for analysis of the Common Hereditary Cancers Panel. Results should be available within approximately 2-3 weeks' time, at which point they will be disclosed by telephone to Ms. Liguori, as will any additional recommendations warranted by these results. Ms. Albaugh will receive a summary of her genetic counseling visit and a copy of her results once available. This information will also be available in Epic.  Based on Ms. Allsbrook's family history, we recommended her siblings have genetic counseling and testing even if her testing is negative. Ms. Kratz will let us know if we can be of any assistance in coordinating genetic counseling and/or testing for this family member.   Lastly, we encouraged Ms. Gutzwiller to remain in contact with cancer genetics annually so that we can continuously update the family history and inform her of any changes in cancer genetics and testing that may be of benefit for this family.   Ms. Crook questions were answered to her satisfaction today. Our contact information was provided should additional questions or concerns arise. Thank you for the referral and allowing [place] to share in the care of your patient.   Faith Rogue, MS Genetic Counselor [email or web address] Phone: [phone]  The patient was seen for a total of 30 minutes in face-to-face genetic counseling.  Drs. Magrinat, Lindi Adie and/or Burr Medico were available for discussion regarding this case.

## 2018-08-13 ENCOUNTER — Telehealth: Payer: Self-pay | Admitting: Licensed Clinical Social Worker

## 2018-08-13 NOTE — Telephone Encounter (Signed)
Patient called with questions about billing. I gave her [facility]'s phone number and let her know they would help her switch to a patient pay price.

## 2018-08-14 ENCOUNTER — Encounter: Payer: Self-pay | Admitting: Licensed Clinical Social Worker

## 2018-08-14 ENCOUNTER — Telehealth: Payer: Self-pay | Admitting: Licensed Clinical Social Worker

## 2018-08-14 ENCOUNTER — Ambulatory Visit: Payer: Self-pay | Admitting: Licensed Clinical Social Worker

## 2018-08-14 DIAGNOSIS — Z800 Family history of malignant neoplasm of digestive organs: Secondary | ICD-10-CM

## 2018-08-14 DIAGNOSIS — Z8041 Family history of malignant neoplasm of ovary: Secondary | ICD-10-CM

## 2018-08-14 DIAGNOSIS — Z1379 Encounter for other screening for genetic and chromosomal anomalies: Secondary | ICD-10-CM | POA: Insufficient documentation

## 2018-08-14 DIAGNOSIS — Z8042 Family history of malignant neoplasm of prostate: Secondary | ICD-10-CM

## 2018-08-14 NOTE — Progress Notes (Signed)
HPI:  Nancy Lynch was previously seen in the [place] clinic due to a family history of cancer and concerns regarding a hereditary predisposition to cancer. Please refer to our prior cancer genetics clinic note for more information regarding our discussion, assessment and recommendations, at the time. Nancy Lynch recent genetic test results were disclosed to her, as were recommendations warranted by these results. These results and recommendations are discussed in more detail below.  CANCER HISTORY:  Oncology History   No history exists.    FAMILY HISTORY:  We obtained a detailed, 4-generation family history.  Significant diagnoses are listed below: Family History  Problem Relation Age of Onset   COPD Mother    Osteoporosis Mother    Heart attack Father    Ovarian cancer Sister        dx 28s   Heart attack Brother    Cancer Brother        "back cancer, sinus cancer and prostate cancer"   Colon cancer Paternal Grandfather    Cancer Maternal Grandfather        unk type    Nancy Lynch has 2 sons, ages 86 and 7, no history of cancer. She had 2 brothers and 2 sisters. One of her sisters had ovarian cancer diagnosed in her 54s and died at 70. One of her brothers is currently going through cancer treatment. She states he had back cancer, sinus cancer, and now prostate cancer. No cancers for her nieces/nephews.  Nancy Lynch mother died at 27. The patient had 2 maternal aunts, 2 maternal uncles, no cancers. No known cancers in cousins, limited info about these individuals. Patient's maternal grandmother died in her late 21s. Her maternal grandfather had cancer but she is unsure the type, possible prostate.  Nancy Lynch father died at 43. Patient had 3 paternal aunts, 2 paternal uncles, no cancers. No known cancers in paternal cousins. Her paternal grandfather had colon cancer but she is unsure age of diagnosis or death. Her paternal grandmother died at 32.   Ms.  Lynch is unaware of previous family history of genetic testing for hereditary cancer risks. There is no reported Ashkenazi Jewish ancestry. There is no known consanguinity.  GENETIC TEST RESULTS: Genetic testing reported out on 08/13/2018 through the [facility] Common Hereditary cancer panel found no pathogenic mutations.   The Common Hereditary Cancers Panel offered by [facility] includes sequencing and/or deletion duplication testing of the following 48 genes: APC, ATM, AXIN2, BARD1, BMPR1A, BRCA1, BRCA2, BRIP1, CDH1, CDKN2A (p14ARF), CDKN2A (p16INK4a), CKD4, CHEK2, CTNNA1, DICER1, EPCAM (Deletion/duplication testing only), GREM1 (promoter region deletion/duplication testing only), KIT, MEN1, MLH1, MSH2, MSH3, MSH6, MUTYH, NBN, NF1, NHTL1, PALB2, PDGFRA, PMS2, POLD1, POLE, PTEN, RAD50, RAD51C, RAD51D, RNF43, SDHB, SDHC, SDHD, SMAD4, SMARCA4. STK11, TP53, TSC1, TSC2, and VHL.  The following genes were evaluated for sequence changes only: SDHA and HOXB13 c.251G>A variant only.  The test report has been scanned into EPIC and is located under the Molecular Pathology section of the Results Review tab.  A portion of the result report is included below for reference.     We discussed with Nancy Lynch that because current genetic testing is not perfect, it is possible there may be a gene mutation in one of these genes that current testing cannot detect, but that chance is small.  We also discussed, that there could be another gene that has not yet been discovered, or that we have not yet tested, that is responsible for the cancer diagnoses in  the family. It is also possible there is a hereditary cause for the cancer in the family that Nancy Lynch did not inherit and therefore was not identified in her testing.  Therefore, it is important to remain in touch with cancer genetics in the future so that we can continue to offer Nancy Lynch the most up to date genetic testing.   Genetic testing did identify 2 Variants of  uncertain significance (VUS) - one in the MLH1 gene called c.1201A>G, a second in the ATM gene called c.496+4T>C(Intronic).  At this time, it is unknown if these variants are associated with increased cancer risk or if they are normal findings, but most variants such as these get reclassified to being inconsequential. They should not be used to make medical management decisions. With time, we suspect the lab will determine the significance of these variants, if any. If we do learn more about them, we will try to contact Nancy Lynch to discuss it further. However, it is important to stay in touch with [place] periodically and keep the address and phone number up to date.  ADDITIONAL GENETIC TESTING: We discussed with Nancy Lynch that her genetic testing was fairly extensive.  If there are genes identified to increase cancer risk that can be analyzed in the future, we would be happy to discuss and coordinate this testing at that time.    CANCER SCREENING RECOMMENDATIONS: Nancy Lynch test result is considered negative (normal).  This means that we have not identified a hereditary cause for her  family history of cancer at this time.   While reassuring, this does not definitively rule out a hereditary predisposition to cancer. It is still possible that there could be genetic mutations that are undetectable by current technology. There could be genetic mutations in genes that have not been tested or identified to increase cancer risk.  Therefore, it is recommended she continue to follow the cancer management and screening guidelines provided by her  primary healthcare provider.   An individual's cancer risk and medical management are not determined by genetic test results alone. Overall cancer risk assessment incorporates additional factors, including personal medical history, family history, and any available genetic information that may result in a personalized plan for cancer prevention and surveillance.    RECOMMENDATIONS FOR FAMILY MEMBERS:  Relatives in this family might be at some increased risk of developing cancer, over the general population risk, simply due to the family history of cancer.  We recommended female relatives in this family have a yearly mammogram beginning at age 63, or 60 years younger than the earliest onset of cancer, an annual clinical breast exam, and perform monthly breast self-exams. Female relatives in this family should also have a gynecological exam as recommended by their primary provider. All family members should have a colonoscopy by age 49, or as directed by their physicians.   It is also possible there is a hereditary cause for the cancer in Nancy Lynch's family that she did not inherit and therefore was not identified in her.  Based on Nancy Lynch's family history, we recommended her siblings and children of her sister with ovarian cancerk have genetic counseling and testing. Nancy Lynch will let us know if we can be of any assistance in coordinating genetic counseling and/or testing for these family members.  FOLLOW-UP: Lastly, we discussed with Nancy Lynch that cancer genetics is a rapidly advancing field and it is possible that new genetic tests will be appropriate for her and/or her family members  in the future. We encouraged her to remain in contact with cancer genetics on an annual basis so we can update her personal and family histories and let her know of advances in cancer genetics that may benefit this family.   Our contact number was provided. Nancy Lynch questions were answered to her satisfaction, and she knows she is welcome to call us at anytime with additional questions or concerns.   Faith Rogue, MS Genetic Counselor [email or web address] Phone: [phone]

## 2018-08-14 NOTE — Telephone Encounter (Signed)
Revealed negative genetic testing.  Revealed that a VUS in MLH1 and ATM was identified. This normal result is reassuring.  It is unlikely that there is an increased risk of  cancer due to a mutation in one of these genes.  However, genetic testing is not perfect, and cannot definitively rule out a hereditary cause.  It will be important for her to keep in contact with genetics to learn if any additional testing may be needed in the future.

## 2018-09-03 ENCOUNTER — Encounter: Payer: Self-pay | Admitting: Licensed Clinical Social Worker

## 2018-09-03 NOTE — Progress Notes (Signed)
UPDATE: VUS in ATM called c.496+4T>C (Intronic) has been reclassified to Likely Benign. The report date is 08/31/2018.

## 2018-09-15 DIAGNOSIS — M9904 Segmental and somatic dysfunction of sacral region: Secondary | ICD-10-CM | POA: Insufficient documentation

## 2019-03-13 ENCOUNTER — Other Ambulatory Visit: Payer: Self-pay

## 2019-03-13 NOTE — Progress Notes (Signed)
59 y.o. [id number] Married Unavailable Not Hispanic or Latino female here for annual exam.  Patient states that she has some burning with urination.  Last week was given prednisone for tendinitis. Which helped. Yesterday she started noticing irritation on the vulvar skin, not itching, feels raw. She c/o a slight odor in the last 24 hours. Not sexually active.  She c/o nocturia x 3 (at least), voids small to normal amounts. No leakage. Some urinary frequency, no urgency. She is also thirsty a lot.  No vaginal bleeding.      No LMP recorded. Patient has had an ablation.          Sexually active: No.  The current method of family planning is abstinence.    Exercising: No.  The patient does not participate in regular exercise at present. Smoker:  no  Health Maintenance: Pap:  03/08/2018 Negative HR HPV Neg  History of abnormal Pap:  Yes 1983 cryo surgery  MMG:  07/17/18  BMD:   BI-RADS  2 Benign  Colonoscopy: yes 2017 per patient  TDaP:  UTD, she will check with her primary  Gardasil: NA   reports that she has never smoked. She has never used smokeless tobacco. She reports that she does not drink alcohol or use drugs. She works in [employer], jewellery, working overtime since covid started. 2 kids and one 78 year old grandchild  Past Medical History:  Diagnosis Date  . Family history of colon cancer   . Family history of ovarian cancer   . Family history of prostate cancer   . History of uterine fibroid     Past Surgical History:  Procedure Laterality Date  . ENDOMETRIAL ABLATION    . GYNECOLOGIC CRYOSURGERY    . NASAL SINUS SURGERY    . SKIN GRAFT    . TUBAL LIGATION    . TUMOR REMOVAL     on thigh, benign per patient    Current Outpatient Medications  Medication Sig Dispense Refill  . Cyanocobalamin (B-12) 100 MCG TABS Take by mouth.    . Multiple Vitamin (MULTIVITAMIN) tablet Take 1 tablet by mouth daily.    . predniSONE (DELTASONE) 20 MG tablet Take 20 mg by mouth 2 (two) times  daily.    Marland Kitchen zinc gluconate 50 MG tablet Take 50 mg by mouth daily.     Current Facility-Administered Medications  Medication Dose Route Frequency Provider Last Rate Last Admin  . triamcinolone acetonide (KENALOG) 10 MG/ML injection 10 mg  10 mg Other Once Brice, Titorya, DPM      . triamcinolone acetonide (KENALOG) 10 MG/ML injection 10 mg  10 mg Other Once Asencion Islam, DPM        Family History  Problem Relation Age of Onset  . COPD Mother   . Osteoporosis Mother   . Heart attack Father   . Ovarian cancer Sister        dx 62s  . Heart attack Brother   . Cancer Brother        "back cancer, sinus cancer and prostate cancer"  . Colon cancer Paternal Grandfather   . Cancer Maternal Grandfather        unk type  Patient had negative genetic testing.   Review of Systems  Constitutional: Negative.   HENT: Negative.   Eyes: Negative.   Respiratory: Negative.   Cardiovascular: Negative.   Gastrointestinal: Negative.   Endocrine: Negative.   Genitourinary: Positive for dysuria.  Musculoskeletal: Negative.   Skin: Negative.   Allergic/Immunologic: Negative.  Neurological: Negative.   Hematological: Negative.   Psychiatric/Behavioral: Negative.     Exam:   BP 118/60   Pulse 96   Temp 97.9 F (36.6 C)   Ht 5' 3.75" (1.619 m)   Wt 211 lb (95.7 kg)   SpO2 96%   BMI 36.50 kg/m   Weight change: @WEIGHTCHANGE @ Height:   Height: 5' 3.75" (161.9 cm)  Ht Readings from Last 3 Encounters:  03/14/19 5' 3.75" (1.619 m)  03/07/18 5\' 4"  (1.626 m)    General appearance: alert, cooperative and appears stated age Head: Normocephalic, without obvious abnormality, atraumatic Neck: no adenopathy, supple, symmetrical, trachea midline and thyroid normal to inspection and palpation Lungs: clear to auscultation bilaterally Cardiovascular: regular rate and rhythm Breasts: normal appearance, no masses or tenderness Abdomen: soft, non-tender; non distended,  no masses,  no  organomegaly Extremities: extremities normal, atraumatic, no cyanosis or edema Skin: Skin color, texture, turgor normal. No rashes or lesions Lymph nodes: Cervical, supraclavicular, and axillary nodes normal. No abnormal inguinal nodes palpated Neurologic: Grossly normal   Pelvic: External genitalia:  no lesions, minimal erythema              Urethra:  normal appearing urethra with no masses, tenderness or lesions              Bartholins and Skenes: normal                 Vagina: mildly atrophic appearing vagina with normal color and discharge, no lesions              Cervix: no lesions               Bimanual Exam:  Uterus:  normal size, contour, position, consistency, mobility, non-tender              Adnexa: no mass, fullness, tenderness               Rectovaginal: Confirms               Anus:  normal sphincter tone, no lesions  Gae Dry chaperoned for the exam.  A:  Well Woman with normal exam  Nocturia  Polydipsia  BMI 36  Vaginal irritation, no findings on exam  Has signs of early candida intertrigo bilateral groin, not symptomatic   P:   No pap this year  Mammogram UTD  Colonoscopy UTD  Screening labs, HgbA1C, TSH  Discussed breast self exam  Discussed calcium and vit D intake  Nuswab for BV and yeast  Given information of vulvar skin care. If her symptoms persist, will treat with topical steroids.

## 2019-03-14 ENCOUNTER — Encounter: Payer: Self-pay | Admitting: Obstetrics and Gynecology

## 2019-03-14 ENCOUNTER — Ambulatory Visit: Payer: BC Managed Care – PPO | Admitting: Obstetrics and Gynecology

## 2019-03-14 ENCOUNTER — Telehealth: Payer: Self-pay | Admitting: Obstetrics and Gynecology

## 2019-03-14 VITALS — BP 118/60 | HR 96 | Temp 97.9°F | Ht 63.75 in | Wt 211.0 lb

## 2019-03-14 DIAGNOSIS — Z0000 Encounter for general adult medical examination without abnormal findings: Secondary | ICD-10-CM

## 2019-03-14 DIAGNOSIS — R631 Polydipsia: Secondary | ICD-10-CM

## 2019-03-14 DIAGNOSIS — R350 Frequency of micturition: Secondary | ICD-10-CM

## 2019-03-14 DIAGNOSIS — N898 Other specified noninflammatory disorders of vagina: Secondary | ICD-10-CM

## 2019-03-14 DIAGNOSIS — Z6836 Body mass index (BMI) 36.0-36.9, adult: Secondary | ICD-10-CM

## 2019-03-14 DIAGNOSIS — Z01419 Encounter for gynecological examination (general) (routine) without abnormal findings: Secondary | ICD-10-CM

## 2019-03-14 LAB — POCT URINALYSIS DIPSTICK
Bilirubin, UA: NEGATIVE
Blood, UA: NEGATIVE
Glucose, UA: NEGATIVE
Ketones, UA: NEGATIVE
Nitrite, UA: NEGATIVE
Protein, UA: NEGATIVE
Urobilinogen, UA: NEGATIVE E.U./dL — AB
pH, UA: 5 (ref 5.0–8.0)

## 2019-03-14 NOTE — Telephone Encounter (Signed)
Spoke with patient. Patient was seen in office today. Patient reports mid upper back pain, just below her bra strap and burning on vulva after voiding. Symptoms started after leaving the office. Advised patient labs are pending. Our office will notify of final results once completed. I will update Dr. Oscar La and return call if any additional recommendations. If new symptoms develop or symptoms worsen, return call to office. Patient agreeable.   Routing to Dr. Shirley Friar.

## 2019-03-14 NOTE — Telephone Encounter (Signed)
Patient called stated she disconnected the call with Noreene Larsson by accident. She asked if Noreene Larsson needs to give her more information.

## 2019-03-14 NOTE — Patient Instructions (Signed)

## 2019-03-14 NOTE — Telephone Encounter (Signed)
Patient says her back is starting to hurt. Was in earlier and want to let Dr.Jertson know in case this a symptom of infection.

## 2019-03-15 LAB — CBC
Hematocrit: 47.9 % — ABNORMAL HIGH (ref 34.0–46.6)
Hemoglobin: 15.9 g/dL (ref 11.1–15.9)
MCH: 30.9 pg (ref 26.6–33.0)
MCHC: 33.2 g/dL (ref 31.5–35.7)
MCV: 93 fL (ref 79–97)
Platelets: 287 10*3/uL (ref 150–450)
RBC: 5.15 x10E6/uL (ref 3.77–5.28)
RDW: 12.2 % (ref 11.7–15.4)
WBC: 13.5 10*3/uL — ABNORMAL HIGH (ref 3.4–10.8)

## 2019-03-15 LAB — COMPREHENSIVE METABOLIC PANEL
ALT: 47 IU/L — ABNORMAL HIGH (ref 0–32)
AST: 35 IU/L (ref 0–40)
Albumin/Globulin Ratio: 1.9 (ref 1.2–2.2)
Albumin: 4.3 g/dL (ref 3.8–4.9)
Alkaline Phosphatase: 116 IU/L (ref 39–117)
BUN/Creatinine Ratio: 23 (ref 9–23)
BUN: 17 mg/dL (ref 6–24)
Bilirubin Total: 0.8 mg/dL (ref 0.0–1.2)
CO2: 21 mmol/L (ref 20–29)
Calcium: 9.5 mg/dL (ref 8.7–10.2)
Chloride: 101 mmol/L (ref 96–106)
Creatinine, Ser: 0.73 mg/dL (ref 0.57–1.00)
GFR calc Af Amer: 106 mL/min/{1.73_m2} (ref >59)
GFR calc non Af Amer: 92 mL/min/{1.73_m2} (ref >59)
Globulin, Total: 2.3 g/dL (ref 1.5–4.5)
Glucose: 178 mg/dL — ABNORMAL HIGH (ref 65–99)
Potassium: 4.1 mmol/L (ref 3.5–5.2)
Sodium: 139 mmol/L (ref 134–144)
Total Protein: 6.6 g/dL (ref 6.0–8.5)

## 2019-03-15 LAB — TSH: TSH: 3.31 u[IU]/mL (ref 0.450–4.500)

## 2019-03-15 LAB — HEMOGLOBIN A1C
Est. average glucose Bld gHb Est-mCnc: 174 mg/dL
Hgb A1c MFr Bld: 7.7 % — ABNORMAL HIGH (ref 4.8–5.6)

## 2019-03-15 LAB — LIPID PANEL
Chol/HDL Ratio: 2.3 ratio (ref 0.0–4.4)
Cholesterol, Total: 162 mg/dL (ref 100–199)
HDL: 72 mg/dL (ref >39)
LDL Chol Calc (NIH): 51 mg/dL (ref 0–99)
Triglycerides: 250 mg/dL — ABNORMAL HIGH (ref 0–149)
VLDL Cholesterol Cal: 39 mg/dL (ref 5–40)

## 2019-03-15 LAB — URINALYSIS, MICROSCOPIC ONLY: Casts: NONE SEEN /lpf

## 2019-03-15 NOTE — Telephone Encounter (Signed)
Spoke with patient. Advised per Dr. Oscar La. Patient request to return for PAP smear, no pap during AEX. Patient states she has reviewed her benefits, request to schedule. OV scheduled for pap on 03/25/19 at 9am with Dr. Oscar La,   Routing to provider for final review. Patient is agreeable to disposition. Will close encounter.

## 2019-03-15 NOTE — Telephone Encounter (Signed)
Her urine culture and vaginitis panel are pending. She can use Vaseline externally if uncomfortable.

## 2019-03-16 LAB — NUSWAB BV AND CANDIDA, NAA
Candida albicans, NAA: NEGATIVE
Candida glabrata, NAA: NEGATIVE

## 2019-03-16 LAB — URINE CULTURE

## 2019-03-16 NOTE — Telephone Encounter (Signed)
She can certainly return for a pap, but the guidelines are to do pap's every 5 years.  One of the reasons for the change in the guidelines is the HPV testing. HPV is the virus that causes cervical cancer. It is sexually transmitted. She tested negative for HPV last year and had a normal pap. The reason we used to do paps yearly, is that they weren't as accurate and had more false negatives.  The idea between the spacing is that even is she acquired HPV since last year it takes many years for the hpv to cause normal cells to turn into cancer. Ultimately I will do whatever she wants.

## 2019-03-18 NOTE — Telephone Encounter (Signed)
Nancy Bolk, MD  03/16/2019 11:09 AM EST    Please let the patient know that she has diabetes and a elevated triglyceride level. Her WBC is slightly elevated which can go along with infection. One of her LFT's is also elevated. She needs to get in to see a primary, if she doesn't have one please help her establish care.  Her urine culture is negative for infection, but she has blood in her urine. She should return for a repeat microscopic urinalysis is a few weeks. If she still has blood she should see Urology.     Spoke with patient. Patient did not want to hear recommendations for PAP, request to proceed as scheduled on 03/25/19.   Advised of results as seen below per Dr. Oscar La. Patient request copy of labs to PCP/Kevin Algoma, [place] at [facility]. Patient will contact their office directly to schedule. Patient will repeat urine micro at 03/25/19 OV. Patient verbalizes understanding and is agreeable.   Labs faxed to [phone]  Routing to provider for final review. Patient is agreeable to disposition. Will close encounter.

## 2019-03-22 ENCOUNTER — Other Ambulatory Visit: Payer: Self-pay

## 2019-03-25 ENCOUNTER — Other Ambulatory Visit (HOSPITAL_COMMUNITY)
Admission: RE | Admit: 2019-03-25 | Discharge: 2019-03-25 | Disposition: A | Discharge status: Home / Self Care | Payer: BC Managed Care – PPO | Source: Ambulatory Visit | Attending: Obstetrics and Gynecology | Admitting: Obstetrics and Gynecology

## 2019-03-25 ENCOUNTER — Other Ambulatory Visit: Payer: Self-pay

## 2019-03-25 ENCOUNTER — Ambulatory Visit: Payer: BC Managed Care – PPO | Admitting: Obstetrics and Gynecology

## 2019-03-25 ENCOUNTER — Encounter: Payer: Self-pay | Admitting: Obstetrics and Gynecology

## 2019-03-25 VITALS — BP 122/70 | HR 96 | Temp 98.3°F | Ht 63.75 in | Wt 208.0 lb

## 2019-03-25 DIAGNOSIS — E119 Type 2 diabetes mellitus without complications: Secondary | ICD-10-CM

## 2019-03-25 DIAGNOSIS — R3129 Other microscopic hematuria: Secondary | ICD-10-CM | POA: Diagnosis not present

## 2019-03-25 DIAGNOSIS — Z124 Encounter for screening for malignant neoplasm of cervix: Secondary | ICD-10-CM | POA: Insufficient documentation

## 2019-03-25 NOTE — Patient Instructions (Signed)
Diabetes Mellitus and Standards of Medical Care Managing diabetes (diabetes mellitus) can be complicated. Your diabetes treatment may be managed by a team of health care providers, including:  A physician who specializes in diabetes (endocrinologist).  A nurse practitioner or physician assistant.  Nurses.  A diet and nutrition specialist (registered dietitian).  A certified diabetes educator (CDE).  An exercise specialist.  A pharmacist.  An eye doctor.  A foot specialist (podiatrist).  A dentist.  A primary care provider.  A mental health provider. Your health care providers follow guidelines to help you get the best quality of care. The following schedule is a general guideline for your diabetes management plan. Your health care providers may give you more specific instructions. Physical exams Upon being diagnosed with diabetes mellitus, and each year after that, your health care provider will ask about your medical and family history. He or she will also do a physical exam. Your exam may include:  Measuring your height, weight, and body mass index (BMI).  Checking your blood pressure. This will be done at every routine medical visit. Your target blood pressure may vary depending on your medical conditions, your age, and other factors.  Thyroid gland exam.  Skin exam.  Screening for damage to your nerves (peripheral neuropathy). This may include checking the pulse in your legs and feet and checking the level of sensation in your hands and feet.  A complete foot exam to inspect the structure and skin of your feet, including checking for cuts, bruises, redness, blisters, sores, or other problems.  Screening for blood vessel (vascular) problems, which may include checking the pulse in your legs and feet and checking your temperature. Blood tests Depending on your treatment plan and your personal needs, you may have the following tests done:  HbA1c (hemoglobin A1c). This  test provides information about blood sugar (glucose) control over the previous 2-3 months. It is used to adjust your treatment plan, if needed. This test will be done: ? At least 2 times a year, if you are meeting your treatment goals. ? 4 times a year, if you are not meeting your treatment goals or if treatment goals have changed.  Lipid testing, including total, LDL, and HDL cholesterol and triglyceride levels. ? The goal for LDL is less than 100 mg/dL (5.5 mmol/L). If you are at high risk for complications, the goal is less than 70 mg/dL (3.9 mmol/L). ? The goal for HDL is 40 mg/dL (2.2 mmol/L) or higher for men and 50 mg/dL (2.8 mmol/L) or higher for women. An HDL cholesterol of 60 mg/dL (3.3 mmol/L) or higher gives some protection against heart disease. ? The goal for triglycerides is less than 150 mg/dL (8.3 mmol/L).  Liver function tests.  Kidney function tests.  Thyroid function tests. Dental and eye exams  Visit your dentist two times a year.  If you have type 1 diabetes, your health care provider may recommend an eye exam 3-5 years after you are diagnosed, and then once a year after your first exam. ? For children with type 1 diabetes, a health care provider may recommend an eye exam when your child is age 10 or older and has had diabetes for 3-5 years. After the first exam, your child should get an eye exam once a year.  If you have type 2 diabetes, your health care provider may recommend an eye exam as soon as you are diagnosed, and then once a year after your first exam. Immunizations   The   yearly flu (influenza) vaccine is recommended for everyone 6 months or older who has diabetes.  The pneumonia (pneumococcal) vaccine is recommended for everyone 2 years or older who has diabetes. If you are 30 or older, you may get the pneumonia vaccine as a series of two separate shots.  The hepatitis B vaccine is recommended for adults shortly after being diagnosed with diabetes.   Adults and children with diabetes should receive all other vaccines according to age-specific recommendations from the Centers for Disease Control and Prevention (CDC). Mental and emotional health Screening for symptoms of eating disorders, anxiety, and depression is recommended at the time of diagnosis and afterward as needed. If your screening shows that you have symptoms (positive screening result), you may need more evaluation and you may work with a mental health care provider. Treatment plan Your treatment plan will be reviewed at every medical visit. You and your health care provider will discuss:  How you are taking your medicines, including insulin.  Any side effects you are experiencing.  Your blood glucose target goals.  The frequency of your blood glucose monitoring.  Lifestyle habits, such as activity level as well as tobacco, alcohol, and substance use. Diabetes self-management education Your health care provider will assess how well you are monitoring your blood glucose levels and whether you are taking your insulin correctly. He or she may refer you to:  A certified diabetes educator to manage your diabetes throughout your life, starting at diagnosis.  A registered dietitian who can create or review your personal nutrition plan.  An exercise specialist who can discuss your activity level and exercise plan. Summary  Managing diabetes (diabetes mellitus) can be complicated. Your diabetes treatment may be managed by a team of health care providers.  Your health care providers follow guidelines in order to help you get the best quality of care.  Standards of care including having regular physical exams, blood tests, blood pressure monitoring, immunizations, screening tests, and education about how to manage your diabetes.  Your health care providers may also give you more specific instructions based on your individual health. This information is not intended to replace  advice given to you by your health care provider. Make sure you discuss any questions you have with your health care provider. Document Revised: 10/13/2017 Document Reviewed: 10/23/2015 Elsevier Patient Education  [place]. Diabetes Mellitus and Nutrition, Adult When you have diabetes (diabetes mellitus), it is very important to have healthy eating habits because your blood sugar (glucose) levels are greatly affected by what you eat and drink. Eating healthy foods in the appropriate amounts, at about the same times every day, can help you:  Control your blood glucose.  Lower your risk of heart disease.  Improve your blood pressure.  Reach or maintain a healthy weight. Every person with diabetes is different, and each person has different needs for a meal plan. Your health care provider may recommend that you work with a diet and nutrition specialist (dietitian) to make a meal plan that is best for you. Your meal plan may vary depending on factors such as:  The calories you need.  The medicines you take.  Your weight.  Your blood glucose, blood pressure, and cholesterol levels.  Your activity level.  Other health conditions you have, such as heart or kidney disease. How do carbohydrates affect me? Carbohydrates, also called carbs, affect your blood glucose level more than any other type of food. Eating carbs naturally raises the amount of glucose in  your blood. Carb counting is a method for keeping track of how many carbs you eat. Counting carbs is important to keep your blood glucose at a healthy level, especially if you use insulin or take certain oral diabetes medicines. It is important to know how many carbs you can safely have in each meal. This is different for every person. Your dietitian can help you calculate how many carbs you should have at each meal and for each snack. Foods that contain carbs include:  Bread, cereal, rice, pasta, and crackers.  Potatoes and  corn.  Peas, beans, and lentils.  Milk and yogurt.  Fruit and juice.  Desserts, such as cakes, cookies, ice cream, and candy. How does alcohol affect me? Alcohol can cause a sudden decrease in blood glucose (hypoglycemia), especially if you use insulin or take certain oral diabetes medicines. Hypoglycemia can be a life-threatening condition. Symptoms of hypoglycemia (sleepiness, dizziness, and confusion) are similar to symptoms of having too much alcohol. If your health care provider says that alcohol is safe for you, follow these guidelines:  Limit alcohol intake to no more than 1 drink per day for nonpregnant women and 2 drinks per day for men. One drink equals 12 oz of beer, 5 oz of wine, or 1 oz of hard liquor.  Do not drink on an empty stomach.  Keep yourself hydrated with water, diet soda, or unsweetened iced tea.  Keep in mind that regular soda, juice, and other mixers may contain a lot of sugar and must be counted as carbs. What are tips for following this plan?  Reading food labels  Start by checking the serving size on the "Nutrition Facts" label of packaged foods and drinks. The amount of calories, carbs, fats, and other nutrients listed on the label is based on one serving of the item. Many items contain more than one serving per package.  Check the total grams (g) of carbs in one serving. You can calculate the number of servings of carbs in one serving by dividing the total carbs by 15. For example, if a food has 30 g of total carbs, it would be equal to 2 servings of carbs.  Check the number of grams (g) of saturated and trans fats in one serving. Choose foods that have low or no amount of these fats.  Check the number of milligrams (mg) of salt (sodium) in one serving. Most people should limit total sodium intake to less than 2,300 mg per day.  Always check the nutrition information of foods labeled as "low-fat" or "nonfat". These foods may be higher in added sugar or  refined carbs and should be avoided.  Talk to your dietitian to identify your daily goals for nutrients listed on the label. Shopping  Avoid buying canned, premade, or processed foods. These foods tend to be high in fat, sodium, and added sugar.  Shop around the outside edge of the grocery store. This includes fresh fruits and vegetables, bulk grains, fresh meats, and fresh dairy. Cooking  Use low-heat cooking methods, such as baking, instead of high-heat cooking methods like deep frying.  Cook using healthy oils, such as olive, canola, or sunflower oil.  Avoid cooking with butter, cream, or high-fat meats. Meal planning  Eat meals and snacks regularly, preferably at the same times every day. Avoid going long periods of time without eating.  Eat foods high in fiber, such as fresh fruits, vegetables, beans, and whole grains. Talk to your dietitian about how many servings of  carbs you can eat at each meal.  Eat 4-6 ounces (oz) of lean protein each day, such as lean meat, chicken, fish, eggs, or tofu. One oz of lean protein is equal to: ? 1 oz of meat, chicken, or fish. ? 1 egg. ?  cup of tofu.  Eat some foods each day that contain healthy fats, such as avocado, nuts, seeds, and fish. Lifestyle  Check your blood glucose regularly.  Exercise regularly as told by your health care provider. This may include: ? 150 minutes of moderate-intensity or vigorous-intensity exercise each week. This could be brisk walking, biking, or water aerobics. ? Stretching and doing strength exercises, such as yoga or weightlifting, at least 2 times a week.  Take medicines as told by your health care provider.  Do not use any products that contain nicotine or tobacco, such as cigarettes and e-cigarettes. If you need help quitting, ask your health care provider.  Work with a Veterinary surgeon or diabetes educator to identify strategies to manage stress and any emotional and social challenges. Questions to ask a  health care provider  Do I need to meet with a diabetes educator?  Do I need to meet with a dietitian?  What number can I call if I have questions?  When are the best times to check my blood glucose? Where to find more information:  American Diabetes Association: diabetes.org  Academy of Nutrition and Dietetics: www.eatright.[employer] of Diabetes and Digestive and Kidney Diseases (NIH): CarF[employer].tn Summary  A healthy meal plan will help you control your blood glucose and maintain a healthy lifestyle.  Working with a diet and nutrition specialist (dietitian) can help you make a meal plan that is best for you.  Keep in mind that carbohydrates (carbs) and alcohol have immediate effects on your blood glucose levels. It is important to count carbs and to use alcohol carefully. This information is not intended to replace advice given to you by your health care provider. Make sure you discuss any questions you have with your health care provider. Document Revised: 01/06/2017 Document Reviewed: 02/29/2016 Elsevier Patient Education  2020 Elsevier Inc. Diabetes Basics  Diabetes (diabetes mellitus) is a long-term (chronic) disease. It occurs when the body does not properly use sugar (glucose) that is released from food after you eat. Diabetes may be caused by one or both of these problems:  Your pancreas does not make enough of a hormone called insulin.  Your body does not react in a normal way to insulin that it makes. Insulin lets sugars (glucose) go into cells in your body. This gives you energy. If you have diabetes, sugars cannot get into cells. This causes high blood sugar (hyperglycemia). Follow these instructions at home: How is diabetes treated? You may need to take insulin or other diabetes medicines daily to keep your blood sugar in balance. Take your diabetes medicines every day as told by your doctor. List your diabetes medicines here: Diabetes medicines   Name of medicine: ______________________________ ? Amount (dose): _______________ Time (a.m./p.m.): _______________ Notes: ___________________________________  Name of medicine: ______________________________ ? Amount (dose): _______________ Time (a.m./p.m.): _______________ Notes: ___________________________________  Name of medicine: ______________________________ ? Amount (dose): _______________ Time (a.m./p.m.): _______________ Notes: ___________________________________ If you use insulin, you will learn how to give yourself insulin by injection. You may need to adjust the amount based on the food that you eat. List the types of insulin you use here: Insulin  Insulin type: ______________________________ ? Amount (dose): _______________ Time (a.m./p.m.): _______________ Notes: ___________________________________  Insulin type: ______________________________ ? Amount (dose): _______________ Time (a.m./p.m.): _______________ Notes: ___________________________________  Insulin type: ______________________________ ? Amount (dose): _______________ Time (a.m./p.m.): _______________ Notes: ___________________________________  Insulin type: ______________________________ ? Amount (dose): _______________ Time (a.m./p.m.): _______________ Notes: ___________________________________  Insulin type: ______________________________ ? Amount (dose): _______________ Time (a.m./p.m.): _______________ Notes: ___________________________________ How do I manage my blood sugar?  Check your blood sugar levels using a blood glucose monitor as directed by your doctor. Your doctor will set treatment goals for you. Generally, you should have these blood sugar levels:  Before meals (preprandial): 80-130 mg/dL (6.2-5.6 mmol/L).  After meals (postprandial): below 180 mg/dL (10 mmol/L).  A1c level: less than 7%. Write down the times that you will check your blood sugar levels: Blood sugar checks  Time:  _______________ Notes: ___________________________________  Time: _______________ Notes: ___________________________________  Time: _______________ Notes: ___________________________________  Time: _______________ Notes: ___________________________________  Time: _______________ Notes: ___________________________________  Time: _______________ Notes: ___________________________________  What do I need to know about low blood sugar? Low blood sugar is called hypoglycemia. This is when blood sugar is at or below 70 mg/dL (3.9 mmol/L). Symptoms may include:  Feeling: ? Hungry. ? Worried or nervous (anxious). ? Sweaty and clammy. ? Confused. ? Dizzy. ? Sleepy. ? Sick to your stomach (nauseous).  Having: ? A fast heartbeat. ? A headache. ? A change in your vision. ? Tingling or no feeling (numbness) around the mouth, lips, or tongue. ? Jerky movements that you cannot control (seizure).  Having trouble with: ? Moving (coordination). ? Sleeping. ? Passing out (fainting). ? Getting upset easily (irritability). Treating low blood sugar To treat low blood sugar, eat or drink something sugary right away. If you can think clearly and swallow safely, follow the 15:15 rule:  Take 15 grams of a fast-acting carb (carbohydrate). Talk with your doctor about how much you should take.  Some fast-acting carbs are: ? Sugar tablets (glucose pills). Take 3-4 glucose pills. ? 6-8 pieces of hard candy. ? 4-6 oz (120-150 mL) of fruit juice. ? 4-6 oz (120-150 mL) of regular (not diet) soda. ? 1 Tbsp (15 mL) honey or sugar.  Check your blood sugar 15 minutes after you take the carb.  If your blood sugar is still at or below 70 mg/dL (3.9 mmol/L), take 15 grams of a carb again.  If your blood sugar does not go above 70 mg/dL (3.9 mmol/L) after 3 tries, get help right away.  After your blood sugar goes back to normal, eat a meal or a snack within 1 hour. Treating very low blood sugar If your  blood sugar is at or below 54 mg/dL (3 mmol/L), you have very low blood sugar (severe hypoglycemia). This is an emergency. Do not wait to see if the symptoms will go away. Get medical help right away. Call your local emergency services (911 in the [place] Do not drive yourself to the hospital. Questions to ask your health care provider  Do I need to meet with a diabetes educator?  What equipment will I need to care for myself at home?  What diabetes medicines do I need? When should I take them?  How often do I need to check my blood sugar?  What number can I call if I have questions?  When is my next doctor's visit?  Where can I find a support group for people with diabetes? Where to find more information  American Diabetes Association: www.diabetes.org  American Association of Diabetes Educators: www.diabeteseducator.org/patient-resources Contact a doctor if:  Your  blood sugar is at or above 240 mg/dL (16.1 mmol/L) for 2 days in a row.  You have been sick or have had a fever for 2 days or more, and you are not getting better.  You have any of these problems for more than 6 hours: ? You cannot eat or drink. ? You feel sick to your stomach (nauseous). ? You throw up (vomit). ? You have watery poop (diarrhea). Get help right away if:  Your blood sugar is lower than 54 mg/dL (3 mmol/L).  You get confused.  You have trouble: ? Thinking clearly. ? Breathing. Summary  Diabetes (diabetes mellitus) is a long-term (chronic) disease. It occurs when the body does not properly use sugar (glucose) that is released from food after digestion.  Take insulin and diabetes medicines as told.  Check your blood sugar every day, as often as told.  Keep all follow-up visits as told by your doctor. This is important. This information is not intended to replace advice given to you by your health care provider. Make sure you discuss any questions you have with your health care provider.  Document Revised: 10/17/2018 Document Reviewed: 04/28/2017 Elsevier Patient Education  2020 [employer].

## 2019-03-25 NOTE — Progress Notes (Signed)
GYNECOLOGY  VISIT   HPI: 59 y.o.   Married Unavailable Not Hispanic or Latino  female   [phone] with No LMP recorded. Patient has had an ablation.   here for a pap. A pap was not done at her annual, not due per ASCCP guidelines. The patient wants a pap, her sister died of cervical cancer and she worries. She had a urinalysis and culture for c/o urinary frequency and urgency earlier this month. The urine culture was negative, but the urine dip returned with 11-30 RBC/hpf.  Her screening lab work earlier this month revealed that she was diabetic with a HgbA1C of 7.7. Her triglycerides and her ALT were also elevated.   She saw her primary after her diagnosis of DM earlier this month. She feels she needs more guidance as to how she should eat and what her FS should be.   GYNECOLOGIC HISTORY: No LMP recorded. Patient has had an ablation. Contraception:PMP Menopausal hormone therapy: none        OB History    Gravida  2   Para  2   Term  2   Preterm      AB      Living  2     SAB      TAB      Ectopic      Multiple      Live Births  2              Patient Active Problem List   Diagnosis Date Noted  . Genetic testing 08/14/2018  . Family history of ovarian cancer   . Family history of colon cancer   . Family history of prostate cancer   . Adjustment insomnia 09/18/2015  . Lumbar degenerative disc disease 05/05/2015  . High risk medication use 04/21/2015  . Impaired glucose tolerance 04/20/2015  . Mixed hyperlipidemia 04/20/2015  . Recurrent major depressive disorder, in remission (HCC) 04/20/2015  . Vitamin D deficiency 04/20/2015  . 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 Family history of colon cancer   . Family history of ovarian cancer   . Family history of prostate cancer   . History of uterine fibroid     Past Surgical History:  Procedure Laterality Date  . ENDOMETRIAL ABLATION    . GYNECOLOGIC  CRYOSURGERY    . NASAL SINUS SURGERY    . SKIN GRAFT    . TUBAL LIGATION    . TUMOR REMOVAL     on thigh, benign per patient    Current Outpatient Medications  Medication Sig Dispense Refill  . amoxicillin-clavulanate (AUGMENTIN) 875-125 MG tablet SMARTSIG:1 Tablet(s) By Mouth Every 12 Hours    . Cyanocobalamin (B-12) 100 MCG TABS Take by mouth.    . Multiple Vitamin (MULTIVITAMIN) tablet Take 1 tablet by mouth daily.    . predniSONE (DELTASONE) 20 MG tablet Take 20 mg by mouth 2 (two) times daily.    Marland Kitchen zinc gluconate 50 MG tablet Take 50 mg by mouth daily.     Current Facility-Administered Medications  Medication Dose Route Frequency Provider Last Rate Last Admin  . triamcinolone acetonide (KENALOG) 10 MG/ML injection 10 mg  10 mg Other Once [place], Titorya, DPM      . triamcinolone acetonide (KENALOG) 10 MG/ML injection 10 mg  10 mg Other Once Asencion Islam, DPM         ALLERGIES: Patient has no known allergies.  Family History  Problem Relation Age of Onset  . COPD Mother   . Osteoporosis Mother   . Heart attack Father   . Ovarian cancer Sister        dx 16s  . Heart attack Brother   . Cancer Brother        "back cancer, sinus cancer and prostate cancer"  . Colon cancer Paternal Grandfather   . Cancer Maternal Grandfather        unk type    Social History   Socioeconomic History  . Marital status: Married    Spouse name: Not on file  . Number of children: Not on file  . Years of education: Not on file  . Highest education level: Not on file  Occupational History  . Not on file  Tobacco Use  . Smoking status: Never Smoker  . Smokeless tobacco: Never Used  Substance and Sexual Activity  . Alcohol use: Never  . Drug use: Never  . Sexual activity: Not Currently    Birth control/protection: Surgical    Comment: ablation, tubal ligation  Other Topics Concern  . Not on file  Social History Narrative  . Not on file   Social Determinants of Health    Financial Resource Strain:   . Difficulty of Paying Living Expenses: Not on file  Food Insecurity:   . Worried About Programme researcher, broadcasting/film/video in the Last Year: Not on file  . Ran Out of Food in the Last Year: Not on file  Transportation Needs:   . Lack of Transportation (Medical): Not on file  . Lack of Transportation (Non-Medical): Not on file  Physical Activity:   . Days of Exercise per Week: Not on file  . Minutes of Exercise per Session: Not on file  Stress:   . Feeling of Stress : Not on file  Social Connections:   . Frequency of Communication with Friends and Family: Not on file  . Frequency of Social Gatherings with Friends and Family: Not on file  . Attends Religious Services: Not on file  . Active Member of Clubs or Organizations: Not on file  . Attends Banker Meetings: Not on file  . Marital Status: Not on file  Intimate Partner Violence:   . Fear of Current or Ex-Partner: Not on file  . Emotionally Abused: Not on file  . Physically Abused: Not on file  . Sexually Abused: Not on file    Review of Systems  All other systems reviewed and are negative.   PHYSICAL EXAMINATION:    BP 122/70   Pulse 96   Temp 98.3 F (36.8 C)   Ht 5' 3.75" (1.619 m)   Wt 208 lb (94.3 kg)   SpO2 99%   BMI 35.98 kg/m     General appearance: alert, cooperative and appears stated age  Pelvic: External genitalia:  no lesions              Urethra:  normal appearing urethra with no masses, tenderness or lesions              Bartholins and Skenes: normal                 Vagina: mildly atrophic appearing vagina with normal color and discharge, no lesions              Cervix:  no lesions  Chaperone was present for exam.  ASSESSMENT Desires pap. Reviewed guidelines Microscopic hematuria at her last visit New diagnosis of type 2  DM, she has lots of questions.     PLAN Pap with reflex hpv Send urine for micro ua, if + blood will refer to Urology Discussed type 2 DM,  information from Epic and Up To Date printed and given to the patient Referral placed for Nutrition consultation   An After Visit Summary was printed and given to the patient.  Over 20 minutes in total patient care

## 2019-03-26 LAB — URINALYSIS, MICROSCOPIC ONLY
Bacteria, UA: NONE SEEN
Casts: NONE SEEN /lpf
WBC, UA: NONE SEEN /hpf (ref 0–5)

## 2019-03-26 LAB — CYTOLOGY - PAP: Diagnosis: NEGATIVE

## 2019-04-04 ENCOUNTER — Telehealth: Payer: Self-pay | Admitting: Obstetrics and Gynecology

## 2019-04-04 NOTE — Telephone Encounter (Signed)
Patient would like recommendations from Dr. Oscar La on care for her daughter in law who recently had bladder surgery and want to switch to another provider.

## 2019-04-05 NOTE — Telephone Encounter (Signed)
Spoke with patient. Daughter in law is not a patient at [facility], advised she should f/u with her provider to address any immediate concerns. Daughter in law may contact the office at anytime to schedule NGYN appt if she desires.   Patient denies any GYN concerns.   Routing to Dr. Oscar La.   Encounter closed.

## 2019-04-09 ENCOUNTER — Encounter: Payer: BC Managed Care – PPO | Attending: Obstetrics and Gynecology | Admitting: Dietician

## 2019-04-09 ENCOUNTER — Other Ambulatory Visit: Payer: Self-pay

## 2019-04-09 ENCOUNTER — Encounter: Payer: Self-pay | Admitting: Dietician

## 2019-04-09 DIAGNOSIS — E119 Type 2 diabetes mellitus without complications: Secondary | ICD-10-CM | POA: Insufficient documentation

## 2019-04-09 NOTE — Progress Notes (Signed)
Patient was seen on 04/09/19 for the first of a series of three diabetes self-management courses at [facility].  Patient Education Plan per assessed needs and concerns is to attend three course education program for Diabetes Self Management Education.  The following learning objectives were met by the patient during this class:  Describe diabetes, types of diabetes and pathophysiology  State some common risk factors for diabetes  Defines the role of glucose and insulin  Describe the relationship between diabetes and cardiovascular and other risks  State the members of the Healthcare Team  States the rationale for glucose monitoring and when to test  State their individual [place] the importance of logging glucose readings and how to interpret the readings  Identifies A1C target  Explain the correlation between A1c and eAG values  State symptoms and treatment of high blood glucose and low blood glucose  Explain proper technique for glucose testing and identify proper sharps disposal  Handouts given during class include:  How to Thrive:  A Guide for Your Journey with Diabetes by the ADA  Meal Plan Card and carbohydrate content list  Dietary intake form  Low Sodium Flavoring Tips  Types of Fats  Dining Out  Label reading  Snack list  Planning a balanced meal  The diabetes portion plate  Diabetes Resources  A1c to eAG Conversion Chart  Blood Glucose Log  Diabetes Recommended Care Schedule  Support Group  Diabetes Success Plan  Core Class Satisfaction Survey   Follow-Up Plan:  Attend core 2

## 2019-04-16 ENCOUNTER — Other Ambulatory Visit: Payer: Self-pay

## 2019-04-16 ENCOUNTER — Encounter: Payer: Self-pay | Admitting: Dietician

## 2019-04-16 ENCOUNTER — Encounter: Payer: BC Managed Care – PPO | Admitting: Dietician

## 2019-04-16 DIAGNOSIS — E119 Type 2 diabetes mellitus without complications: Secondary | ICD-10-CM | POA: Diagnosis not present

## 2019-04-16 NOTE — Progress Notes (Signed)
Patient was seen on 3/9/ for the first of a series of three diabetes self-management courses at the [facility].  Patient Education Plan per assessed needs and concerns is to attend three course education program for Diabetes Self Management Education.  The following learning objectives were met by the patient during this class:  Describe diabetes, types of diabetes and pathophysiology  State some common risk factors for diabetes  Defines the role of glucose and insulin  Describe the relationship between diabetes and cardiovascular and other risks  State the members of the Healthcare Team  States the rationale for glucose monitoring and when to test  State their individual [place] the importance of logging glucose readings and how to interpret the readings  Identifies A1C target  Explain the correlation between A1c and eAG values  State symptoms and treatment of high blood glucose and low blood glucose  Explain proper technique for glucose testing and identify proper sharps disposal  Handouts given during class include:  How to Thrive:  A Guide for Your Journey with Diabetes by the ADA  Meal Plan Card and carbohydrate content list  Dietary intake form  Low Sodium Flavoring Tips  Types of Fats  Dining Out  Label reading  Snack list  Planning a balanced meal  The diabetes portion plate  Diabetes Resources  A1c to eAG Conversion Chart  Blood Glucose Log  Diabetes Recommended Care Schedule  Support Group  Diabetes Success Plan  Core Class Satisfaction Survey   Follow-Up Plan:  Attend core 2

## 2019-04-23 ENCOUNTER — Encounter: Payer: BC Managed Care – PPO | Admitting: Dietician

## 2019-04-23 ENCOUNTER — Other Ambulatory Visit: Payer: Self-pay

## 2019-04-23 DIAGNOSIS — E119 Type 2 diabetes mellitus without complications: Secondary | ICD-10-CM

## 2019-04-25 ENCOUNTER — Encounter: Payer: Self-pay | Admitting: Dietician

## 2019-04-25 NOTE — Progress Notes (Signed)
Patient was seen on 04/23/2019 for the third of a series of three diabetes self-management courses at the [facility].   [place] the amount of activity recommended for healthy living . Describe activities suitable for individual needs . Identify ways to regularly incorporate activity into daily life . Identify barriers to activity and ways to over come these barriers  Identify diabetes medications being personally used and their primary action for lowering glucose and possible side effects . Describe role of stress on blood glucose and develop strategies to address psychosocial issues . Identify diabetes complications and ways to prevent them  Explain how to manage diabetes during illness . Evaluate success in meeting personal goal . Establish 2-3 goals that they will plan to diligently work on  Goals:   I will be active 30 minutes or more 5 times a week  I will test my glucose at least 2 times a day, 5 days a week  Your patient has identified these potential barriers to change:  Time  Your patient has identified their diabetes self-care support plan as   [facility] Support Group    Plan:  Attend Support Group as desired

## 2019-07-29 ENCOUNTER — Telehealth: Payer: Self-pay | Admitting: Dietician

## 2019-07-29 NOTE — Telephone Encounter (Signed)
Patient called stating that she had lost the educational information that was provided at the diabetes classes that she attended this past spring.   Packet has been sent to patient.  Oran Rein, RD, LDN, CDCES

## 2019-10-24 DIAGNOSIS — G4733 Obstructive sleep apnea (adult) (pediatric): Secondary | ICD-10-CM | POA: Insufficient documentation

## 2019-10-24 DIAGNOSIS — J328 Other chronic sinusitis: Secondary | ICD-10-CM | POA: Insufficient documentation

## 2019-12-19 IMAGING — MR MR LUMBAR SPINE W/O CM
4 of 5 series · 26 of 48 positions shown · non-contrast
Comparison: None.

CLINICAL DATA: Back pain since May 2017. Status post fall.
Tingling and numbness.

EXAM:
MRI LUMBAR SPINE WITHOUT CONTRAST
TECHNIQUE: Multiplanar, multisequence MR imaging of the lumbar spine was
performed. No intravenous contrast was administered.

[Series 3: T2 · sagittal · 4.0mm · 0.55mm/px · 6 of 15 slices shown (1 of 2)]
[im 1/15]
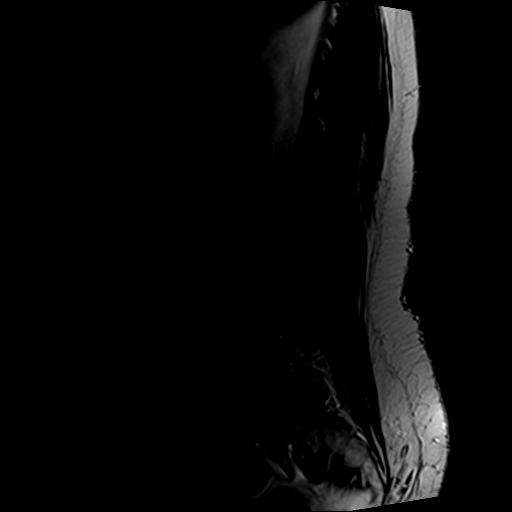
[im 3/15]
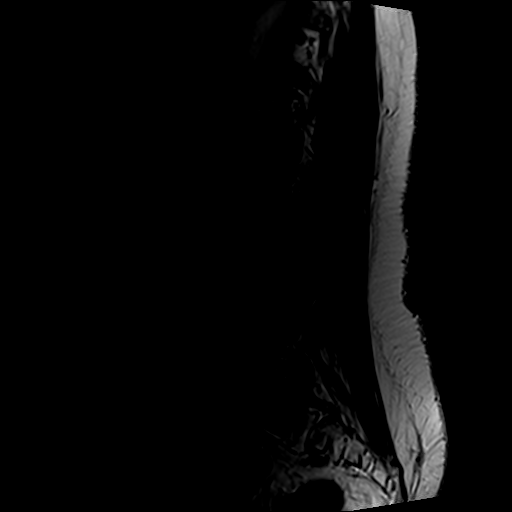
[im 6/15]
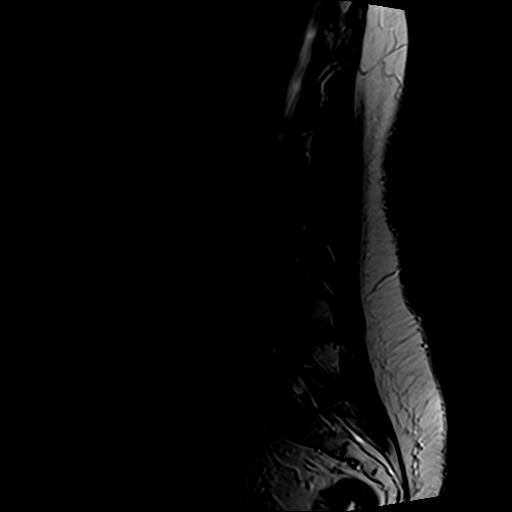
[im 9/15]
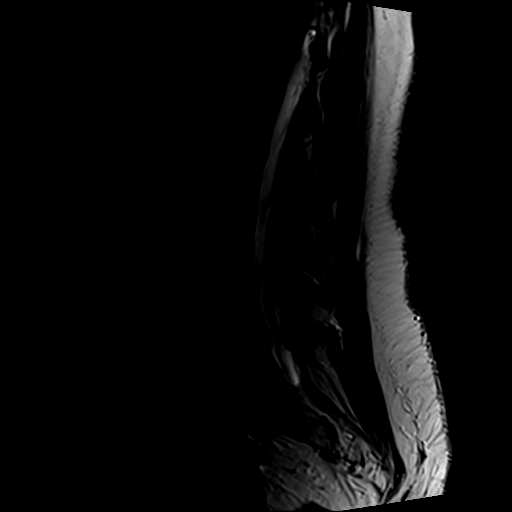
[im 12/15]
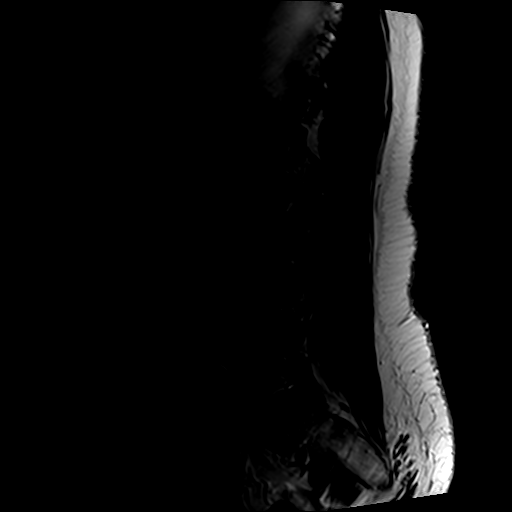
[im 15/15]
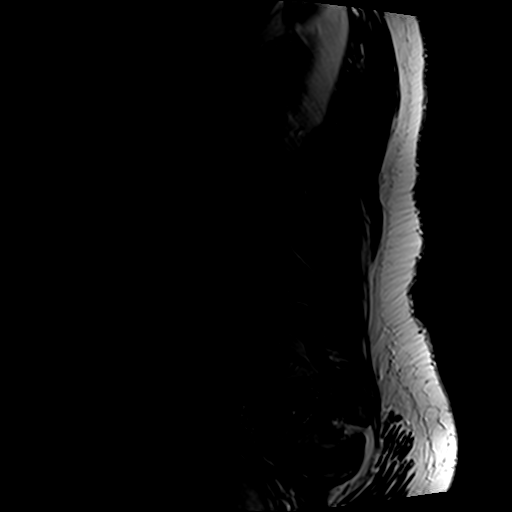

[Series 4: T1 · sagittal · 4.0mm · 0.55mm/px · 6 of 15 slices shown (1 of 2)]
[im 1/15]
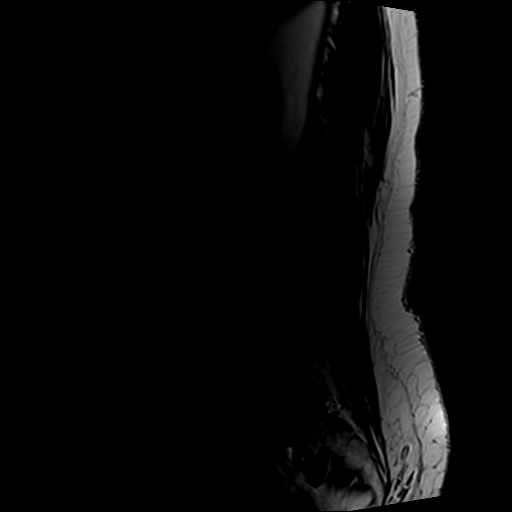
[im 3/15]
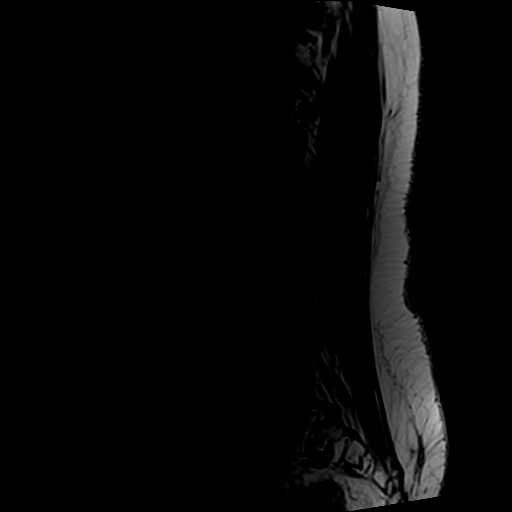
[im 6/15]
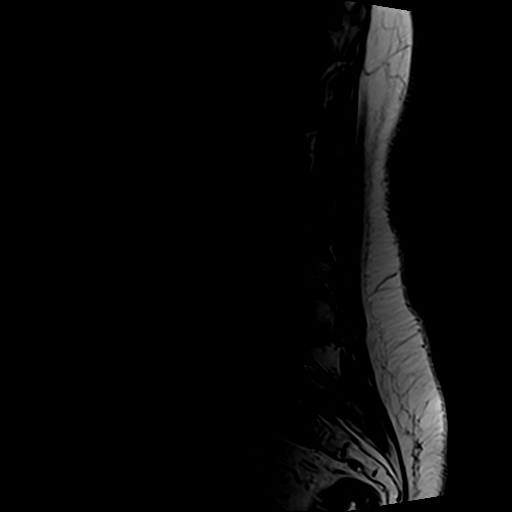
[im 9/15]
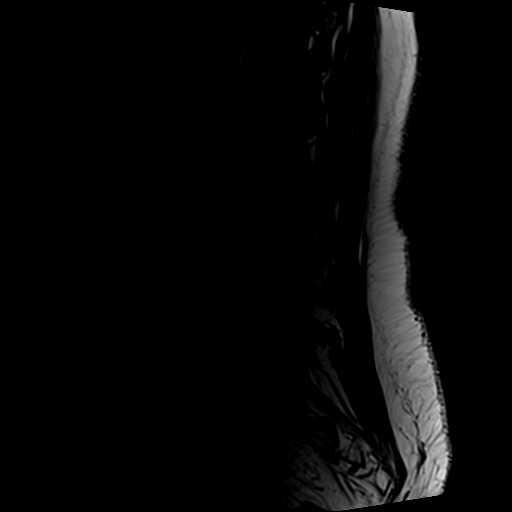
[im 12/15]
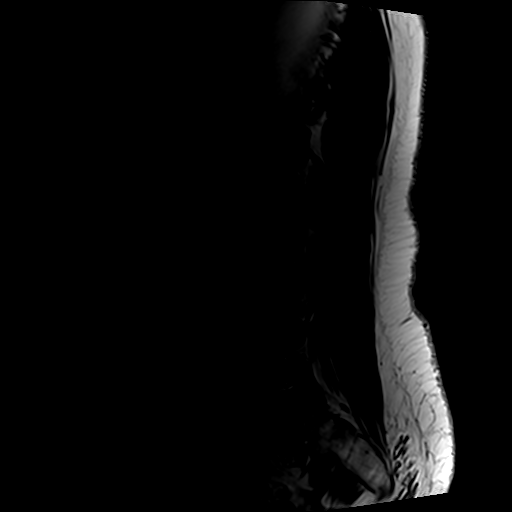
[im 15/15]
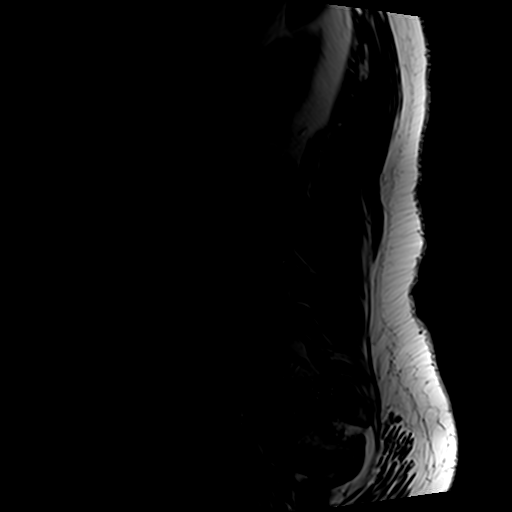

[Series 6: T2 · axial · 4.0mm · 0.70mm/px · z∈[-52,+159]mm · 9 of 39 slices shown (2 of 2)]
[im 1/39]
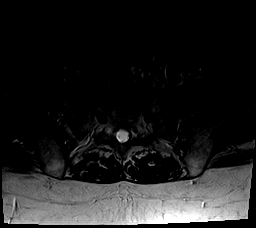
[im 6/39]
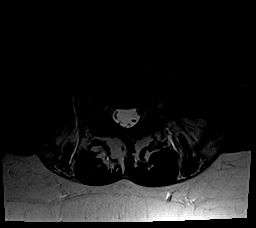
[im 11/39]
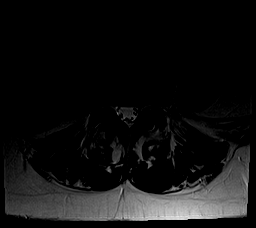
[im 17/39]
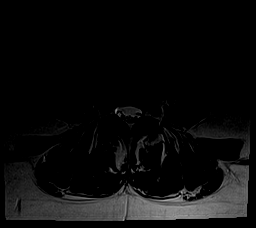
[im 20/39]
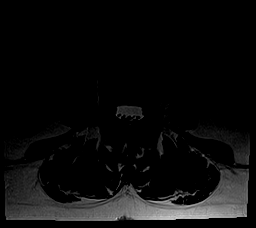
[im 22/39]
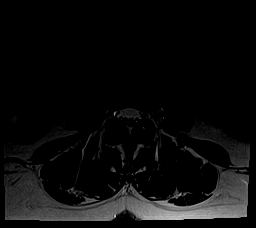
[im 28/39]
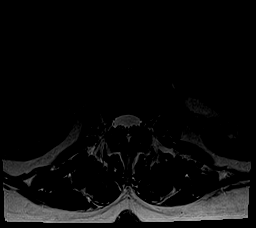
[im 33/39]
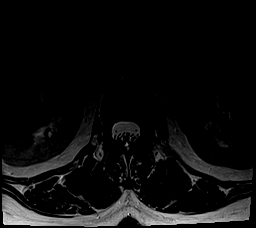
[im 39/39]
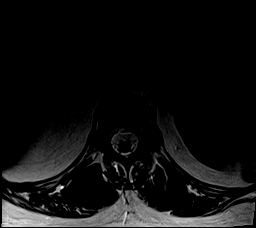

[Series 7: T1 · axial · 4.0mm · 0.35mm/px · z∈[-52,+129]mm · 5 of 39 slices shown (2 of 2)]
[im 1/39]
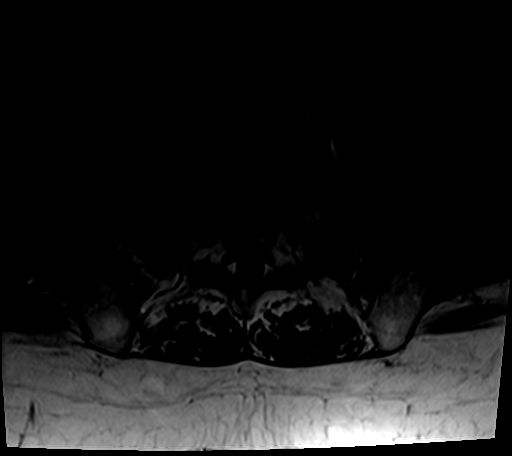
[im 6/39]
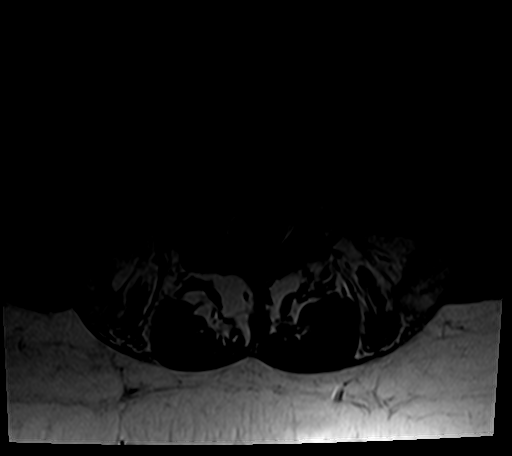
[im 11/39]
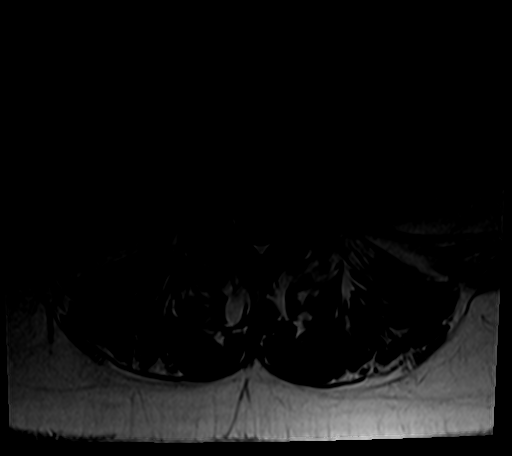
[im 20/39]
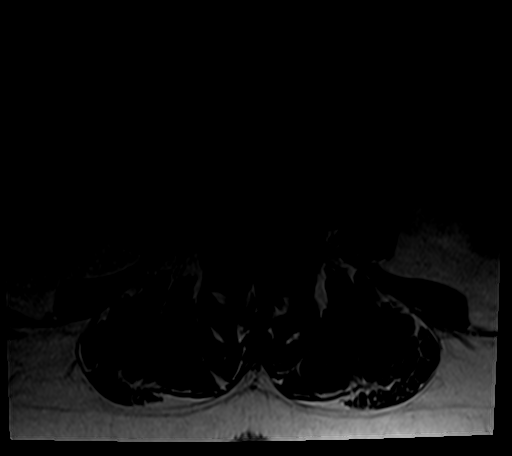
[im 33/39]
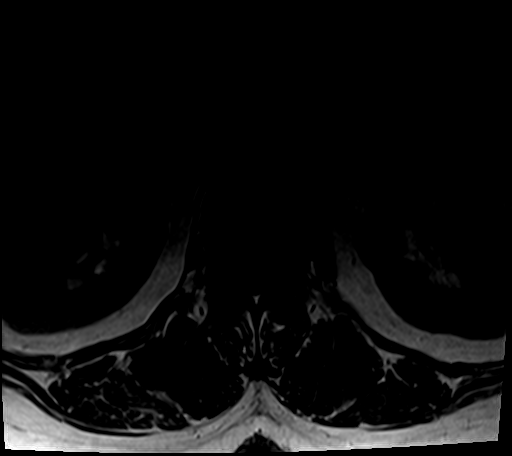

[26 of 48 positions shown; findings below may reference images not displayed]

FINDINGS: Segmentation:  Standard.

Alignment:  Physiologic.

Vertebrae: No fracture, evidence of discitis, or bone lesion.
Abnormal left L5 transverse process articulating with the sacrum.

Conus medullaris and cauda equina: Conus extends to the T12-L1
level. Conus and cauda equina appear normal.

Paraspinal and other soft tissues: No acute paraspinal abnormality.

Disc levels:

Disc spaces: Mild disc desiccation throughout the lumbar spine.

T12-L1: No significant disc bulge. No evidence of neural foraminal
stenosis. No central canal stenosis.

L1-L2: No significant disc bulge. No evidence of neural foraminal
stenosis. No central canal stenosis.

L2-L3: No significant disc bulge. No evidence of neural foraminal
stenosis. No central canal stenosis.

L3-L4: Mild broad-based disc bulge with a left lateral annular
fissure. Mild bilateral facet arthropathy. No evidence of neural
foraminal stenosis. No central canal stenosis.

L4-L5: Broad-based disc bulge with a small broad right paracentral
disc protrusion contacting the right intraspinal L5 nerve root. Mild
bilateral facet arthropathy. No evidence of neural foraminal
stenosis. No central canal stenosis.

L5-S1: No significant disc bulge. No evidence of neural foraminal
stenosis. No central canal stenosis.
IMPRESSION: 1. At L4-5 there is a broad-based disc bulge with a small broad
right paracentral disc protrusion contacting the right intraspinal
L5 nerve root. Mild bilateral facet arthropathy.

## 2020-03-18 ENCOUNTER — Ambulatory Visit (INDEPENDENT_AMBULATORY_CARE_PROVIDER_SITE_OTHER): Payer: BC Managed Care – PPO | Admitting: Obstetrics and Gynecology

## 2020-03-18 ENCOUNTER — Encounter: Payer: Self-pay | Admitting: Obstetrics and Gynecology

## 2020-03-18 ENCOUNTER — Other Ambulatory Visit: Payer: Self-pay

## 2020-03-18 ENCOUNTER — Other Ambulatory Visit (HOSPITAL_COMMUNITY)
Admission: RE | Admit: 2020-03-18 | Discharge: 2020-03-18 | Disposition: A | Discharge status: Home / Self Care | Payer: BC Managed Care – PPO | Source: Ambulatory Visit | Attending: Obstetrics and Gynecology | Admitting: Obstetrics and Gynecology

## 2020-03-18 VITALS — BP 120/72 | HR 72 | Ht 64.0 in | Wt 201.0 lb

## 2020-03-18 DIAGNOSIS — Z01419 Encounter for gynecological examination (general) (routine) without abnormal findings: Secondary | ICD-10-CM | POA: Diagnosis not present

## 2020-03-18 DIAGNOSIS — Z124 Encounter for screening for malignant neoplasm of cervix: Secondary | ICD-10-CM | POA: Insufficient documentation

## 2020-03-18 DIAGNOSIS — Z0000 Encounter for general adult medical examination without abnormal findings: Secondary | ICD-10-CM | POA: Diagnosis not present

## 2020-03-18 DIAGNOSIS — E559 Vitamin D deficiency, unspecified: Secondary | ICD-10-CM

## 2020-03-18 DIAGNOSIS — Z8639 Personal history of other endocrine, nutritional and metabolic disease: Secondary | ICD-10-CM | POA: Diagnosis not present

## 2020-03-18 DIAGNOSIS — Z6834 Body mass index (BMI) 34.0-34.9, adult: Secondary | ICD-10-CM | POA: Diagnosis not present

## 2020-03-18 NOTE — Progress Notes (Signed)
60 y.o. [id number] Married Unavailable Not Hispanic or Latino female here for annual exam.    H/O OAB, not leaking. She voids frequently, ~q2 hours, sometimes every hours. Sometimes double voids, second time she voids a small amount. She is having 1-2 cups of coffee a day.   No bowel c/o.  Not sexually active. No vaginal bleeding.   Patient diagnosed with DM last year.     No LMP recorded. Patient has had an ablation.          Sexually active: No.  The current method of family planning is ablation.    Exercising: No.  The patient does not participate in regular exercise at present. Smoker:  no  Health Maintenance: Pap:  03/25/19 Neg  03/08/18 Neg:neg HR HPV History of abnormal Pap:  Yes, 1983 Cryo surgery MMG:  08/08/19 [facility] normal  BMD:   none Colonoscopy: years ago per patient TDaP:  unsure Gardasil: n/a   reports that she has never smoked. She has never used smokeless tobacco. She reports that she does not drink alcohol and does not use drugs. She works in Designer, jewellery. 2 kids and one 100 year old grandchild  Past Medical History:  Diagnosis Date  . Diabetes mellitus without complication (HCC)   . Family history of colon cancer   . Family history of ovarian cancer   . Family history of prostate cancer   . History of uterine fibroid     Past Surgical History:  Procedure Laterality Date  . ENDOMETRIAL ABLATION    . GYNECOLOGIC CRYOSURGERY    . NASAL SINUS SURGERY    . SKIN GRAFT    . TUBAL LIGATION    . TUMOR REMOVAL     on thigh, benign per patient    Current Outpatient Medications  Medication Sig Dispense Refill  . Ascorbic Acid (VITAMIN C) 100 MG tablet Take by mouth.    . Cyanocobalamin (B-12) 100 MCG TABS Take by mouth.    . Multiple Vitamin (MULTIVITAMIN) tablet Take 1 tablet by mouth daily.    Marland Kitchen VITAMIN D PO Take by mouth.    . zinc gluconate 50 MG tablet Take 50 mg by mouth daily.     Current Facility-Administered Medications  Medication Dose  Route Frequency Provider Last Rate Last Admin  . triamcinolone acetonide (KENALOG) 10 MG/ML injection 10 mg  10 mg Other Once [place], Titorya, DPM      . triamcinolone acetonide (KENALOG) 10 MG/ML injection 10 mg  10 mg Other Once Asencion Islam, DPM        Family History  Problem Relation Age of Onset  . COPD Mother   . Osteoporosis Mother   . Heart attack Father   . Ovarian cancer Sister        dx 22s  . Heart attack Brother   . Cancer Brother        "back cancer, sinus cancer and prostate cancer"  . Colon cancer Paternal Grandfather   . Cancer Maternal Grandfather        unk type  Brother is dying from his cancer.   Review of Systems  Constitutional: Negative.   HENT: Negative.   Eyes: Negative.   Respiratory: Negative.   Cardiovascular: Negative.   Gastrointestinal: Negative.   Endocrine: Negative.   Genitourinary: Negative.   Musculoskeletal: Negative.   Skin: Negative.   Allergic/Immunologic: Negative.   Neurological: Negative.   Hematological: Negative.   Psychiatric/Behavioral: Negative.     Exam:   BP 120/72 (  BP Location: Left Arm, Patient Position: Sitting, Cuff Size: Large)   Pulse 72   Ht 5\' 4"  (1.626 m)   Wt 201 lb (91.2 kg)   BMI 34.50 kg/m   Weight change: @WEIGHTCHANGE @ Height:   Height: 5\' 4"  (162.6 cm)  Ht Readings from Last 3 Encounters:  03/18/20 5\' 4"  (1.626 m)  04/09/19 5' 4.5" (1.638 m)  03/25/19 5' 3.75" (1.619 m)    General appearance: alert, cooperative and appears stated age Head: Normocephalic, without obvious abnormality, atraumatic Neck: no adenopathy, supple, symmetrical, trachea midline and thyroid normal to inspection and palpation Lungs: clear to auscultation bilaterally Cardiovascular: regular rate and rhythm Breasts: normal appearance, no masses or tenderness Abdomen: soft, non-tender; non distended,  no masses,  no organomegaly Extremities: extremities normal, atraumatic, no cyanosis or edema Skin: Skin color, texture,  turgor normal. No rashes or lesions Lymph nodes: Cervical, supraclavicular, and axillary nodes normal. No abnormal inguinal nodes palpated Neurologic: Grossly normal   Pelvic: External genitalia:  no lesions              Urethra:  normal appearing urethra with no masses, tenderness or lesions              Bartholins and Skenes: normal                 Vagina: normal appearing vagina with normal color and discharge, no lesions              Cervix: no lesions               Bimanual Exam:  Uterus:  no massed or tenderness              Adnexa: no mass, fullness, tenderness               Rectovaginal: Confirms               Anus:  normal sphincter tone, no lesions  05/16/20 chaperoned for the exam.  1. Well woman exam Discussed breast self exam Discussed calcium and vit D intake Mammogram UTD Last year we recorded that 2017 was the last time she had a colonoscopy. She will check with her primary when her colonoscopy is due  2. Laboratory exam ordered as part of routine general medical examination Will send a copy to her primary - CBC - Comprehensive metabolic panel - Lipid panel  3. BMI 34.0-34.9,adult  - Lipid panel - TSH  4. History of diabetes mellitus  - Hemoglobin A1c  5. Screening for cervical cancer Discussed the guidelines, she desires yearly paps - Cytology - PAP  6. Vitamin D deficiency Previously on prescription vit d - VITAMIN D 25 Hydroxy (Vit-D Deficiency, Fractures)

## 2020-03-18 NOTE — Patient Instructions (Addendum)
EXERCISE   We recommended that you start or continue a regular exercise program for good health. Physical activity is anything that gets your body moving, some is better than none. The CDC recommends 150 minutes per week of Moderate-Intensity Aerobic Activity and 2 or more days of Muscle Strengthening Activity.  Benefits of exercise are limitless: helps weight loss/weight maintenance, improves mood and energy, helps with depression and anxiety, improves sleep, tones and strengthens muscles, improves balance, improves bone density, protects from chronic conditions such as heart disease, high blood pressure and diabetes and so much more. To learn more visit: https://www.cdc.gov/physicalactivity/index.html  DIET: Good nutrition starts with a healthy diet of fruits, vegetables, whole grains, and lean protein sources. Drink plenty of water for hydration. Minimize empty calories, sodium, sweets. For more information about dietary recommendations visit: https://health.gov/our-work/nutrition-physical-activity/dietary-guidelines and https://www.myplate.gov/  ALCOHOL:  Women should limit their alcohol intake to no more than 7 drinks/beers/glasses of wine (combined, not each!) per week. Moderation of alcohol intake to this level decreases your risk of breast cancer and liver damage.  If you are concerned that you may have a problem, or your friends have told you they are concerned about your drinking, there are many resources to help. A well-known program that is free, effective, and available to all people all over the nation is Alcoholics Anonymous.  Check out this site to learn more: https://www.aa.org/   CALCIUM AND VITAMIN D:  Adequate intake of calcium and Vitamin D are recommended for bone health.  You should be getting between 1000-1200 mg of calcium and 800 units of Vitamin D daily between diet and supplements  PAP SMEARS:  Pap smears, to check for cervical cancer or precancers,  have traditionally been  done yearly, scientific advances have shown that most women can have pap smears less often.  However, every woman still should have a physical exam from her gynecologist every year. It will include a breast check, inspection of the vulva and vagina to check for abnormal growths or skin changes, a visual exam of the cervix, and then an exam to evaluate the size and shape of the uterus and ovaries. We will also provide age appropriate advice regarding health maintenance, like when you should have certain vaccines, screening for sexually transmitted diseases, bone density testing, colonoscopy, mammograms, etc.   MAMMOGRAMS:  All women over 40 years old should have a routine mammogram.   COLON CANCER SCREENING: Now recommend starting at age 45. At this time colonoscopy is not covered for routine screening until 50. There are take home tests that can be done between 45-49.   COLONOSCOPY:  Colonoscopy to screen for colon cancer is recommended for all women at age 50.  We know, you hate the idea of the prep.  We agree, BUT, having colon cancer and not knowing it is worse!!  Colon cancer so often starts as a polyp that can be seen and removed at colonscopy, which can quite literally save your life!  And if your first colonoscopy is normal and you have no family history of colon cancer, most women don't have to have it again for 10 years.  Once every ten years, you can do something that may end up saving your life, right?  We will be happy to help you get it scheduled when you are ready.  Be sure to check your insurance coverage so you understand how much it will cost.  It may be covered as a preventative service at no cost, but you should check   your particular policy.      Breast Self-Awareness Breast self-awareness means being familiar with how your breasts look and feel. It involves checking your breasts regularly and reporting any changes to your health care provider. Practicing breast self-awareness is  important. A change in your breasts can be a sign of a serious medical problem. Being familiar with how your breasts look and feel allows you to find any problems early, when treatment is more likely to be successful. All women should practice breast self-awareness, including women who have had breast implants. How to do a breast self-exam One way to learn what is normal for your breasts and whether your breasts are changing is to do a breast self-exam. To do a breast self-exam: Look for Changes  1. Remove all the clothing above your waist. 2. Stand in front of a mirror in a room with good lighting. 3. Put your hands on your hips. 4. Push your hands firmly downward. 5. Compare your breasts in the mirror. Look for differences between them (asymmetry), such as: ? Differences in shape. ? Differences in size. ? Puckers, dips, and bumps in one breast and not the other. 6. Look at each breast for changes in your skin, such as: ? Redness. ? Scaly areas. 7. Look for changes in your nipples, such as: ? Discharge. ? Bleeding. ? Dimpling. ? Redness. ? A change in position. Feel for Changes Carefully feel your breasts for lumps and changes. It is best to do this while lying on your back on the floor and again while sitting or standing in the shower or tub with soapy water on your skin. Feel each breast in the following way:  Place the arm on the side of the breast you are examining above your head.  Feel your breast with the other hand.  Start in the nipple area and make  inch (2 cm) overlapping circles to feel your breast. Use the pads of your three middle fingers to do this. Apply light pressure, then medium pressure, then firm pressure. The light pressure will allow you to feel the tissue closest to the skin. The medium pressure will allow you to feel the tissue that is a little deeper. The firm pressure will allow you to feel the tissue close to the ribs.  Continue the overlapping circles,  moving downward over the breast until you feel your ribs below your breast.  Move one finger-width toward the center of the body. Continue to use the  inch (2 cm) overlapping circles to feel your breast as you move slowly up toward your collarbone.  Continue the up and down exam using all three pressures until you reach your armpit.  Write Down What You Find  Write down what is normal for each breast and any changes that you find. Keep a written record with breast changes or normal findings for each breast. By writing this information down, you do not need to depend only on memory for size, tenderness, or location. Write down where you are in your menstrual cycle, if you are still menstruating. If you are having trouble noticing differences in your breasts, do not get discouraged. With time you will become more familiar with the variations in your breasts and more comfortable with the exam. How often should I examine my breasts? Examine your breasts every month. If you are breastfeeding, the best time to examine your breasts is after a feeding or after using a breast pump. If you menstruate, the best time to   examine your breasts is 5-7 days after your period is over. During your period, your breasts are lumpier, and it may be more difficult to notice changes. When should I see my health care provider? See your health care provider if you notice:  A change in shape or size of your breasts or nipples.  A change in the skin of your breast or nipples, such as a reddened or scaly area.  Unusual discharge from your nipples.  A lump or thick area that was not there before.  Pain in your breasts.  Anything that concerns you. EXERCISE   We recommended that you start or continue a regular exercise program for good health. Physical activity is anything that gets your body moving, some is better than none. The CDC recommends 150 minutes per week of Moderate-Intensity Aerobic Activity and 2 or more  days of Muscle Strengthening Activity.  Benefits of exercise are limitless: helps weight loss/weight maintenance, improves mood and energy, helps with depression and anxiety, improves sleep, tones and strengthens muscles, improves balance, improves bone density, protects from chronic conditions such as heart disease, high blood pressure and diabetes and so much more. To learn more visit: http://kirby-bean.org/  DIET: Good nutrition starts with a healthy diet of fruits, vegetables, whole grains, and lean protein sources. Drink plenty of water for hydration. Minimize empty calories, sodium, sweets. For more information about dietary recommendations visit: CriticalGas.be and https://www.carpenter-henry.info/  ALCOHOL:  Women should limit their alcohol intake to no more than 7 drinks/beers/glasses of wine (combined, not each!) per week. Moderation of alcohol intake to this level decreases your risk of breast cancer and liver damage.  If you are concerned that you may have a problem, or your friends have told you they are concerned about your drinking, there are many resources to help. A well-known program that is free, effective, and available to all people all over the nation is Alcoholics Anonymous.  Check out this site to learn more: BeverageBargains.co.za   CALCIUM AND VITAMIN D:  Adequate intake of calcium and Vitamin D are recommended for bone health.  You should be getting between 1000-1200 mg of calcium and 800 units of Vitamin D daily between diet and supplements  PAP SMEARS:  Pap smears, to check for cervical cancer or precancers,  have traditionally been done yearly, scientific advances have shown that most women can have pap smears less often.  However, every woman still should have a physical exam from her gynecologist every year. It will include a breast check, inspection of the vulva and vagina to check for abnormal  growths or skin changes, a visual exam of the cervix, and then an exam to evaluate the size and shape of the uterus and ovaries. We will also provide age appropriate advice regarding health maintenance, like when you should have certain vaccines, screening for sexually transmitted diseases, bone density testing, colonoscopy, mammograms, etc.   MAMMOGRAMS:  All women over 23 years old should have a routine mammogram.   COLON CANCER SCREENING: Now recommend starting at age 12. At this time colonoscopy is not covered for routine screening until 50. There are take home tests that can be done between 45-49.   COLONOSCOPY:  Colonoscopy to screen for colon cancer is recommended for all women at age 29.  We know, you hate the idea of the prep.  We agree, BUT, having colon cancer and not knowing it is worse!!  Colon cancer so often starts as a polyp that can be seen and removed  at colonscopy, which can quite literally save your life!  And if your first colonoscopy is normal and you have no family history of colon cancer, most women don't have to have it again for 10 years.  Once every ten years, you can do something that may end up saving your life, right?  We will be happy to help you get it scheduled when you are ready.  Be sure to check your insurance coverage so you understand how much it will cost.  It may be covered as a preventative service at no cost, but you should check your particular policy.      Breast Self-Awareness Breast self-awareness means being familiar with how your breasts look and feel. It involves checking your breasts regularly and reporting any changes to your health care provider. Practicing breast self-awareness is important. A change in your breasts can be a sign of a serious medical problem. Being familiar with how your breasts look and feel allows you to find any problems early, when treatment is more likely to be successful. All women should practice breast self-awareness, including  women who have had breast implants. How to do a breast self-exam One way to learn what is normal for your breasts and whether your breasts are changing is to do a breast self-exam. To do a breast self-exam: Look for Changes  8. Remove all the clothing above your waist. 9. Stand in front of a mirror in a room with good lighting. 10. Put your hands on your hips. 11. Push your hands firmly downward. 12. Compare your breasts in the mirror. Look for differences between them (asymmetry), such as: ? Differences in shape. ? Differences in size. ? Puckers, dips, and bumps in one breast and not the other. 13. Look at each breast for changes in your skin, such as: ? Redness. ? Scaly areas. 14. Look for changes in your nipples, such as: ? Discharge. ? Bleeding. ? Dimpling. ? Redness. ? A change in position. Feel for Changes Carefully feel your breasts for lumps and changes. It is best to do this while lying on your back on the floor and again while sitting or standing in the shower or tub with soapy water on your skin. Feel each breast in the following way:  Place the arm on the side of the breast you are examining above your head.  Feel your breast with the other hand.  Start in the nipple area and make  inch (2 cm) overlapping circles to feel your breast. Use the pads of your three middle fingers to do this. Apply light pressure, then medium pressure, then firm pressure. The light pressure will allow you to feel the tissue closest to the skin. The medium pressure will allow you to feel the tissue that is a little deeper. The firm pressure will allow you to feel the tissue close to the ribs.  Continue the overlapping circles, moving downward over the breast until you feel your ribs below your breast.  Move one finger-width toward the center of the body. Continue to use the  inch (2 cm) overlapping circles to feel your breast as you move slowly up toward your collarbone.  Continue the up and  down exam using all three pressures until you reach your armpit.  Write Down What You Find  Write down what is normal for each breast and any changes that you find. Keep a written record with breast changes or normal findings for each breast. By writing this information down,  you do not need to depend only on memory for size, tenderness, or location. Write down where you are in your menstrual cycle, if you are still menstruating. If you are having trouble noticing differences in your breasts, do not get discouraged. With time you will become more familiar with the variations in your breasts and more comfortable with the exam. How often should I examine my breasts? Examine your breasts every month. If you are breastfeeding, the best time to examine your breasts is after a feeding or after using a breast pump. If you menstruate, the best time to examine your breasts is 5-7 days after your period is over. During your period, your breasts are lumpier, and it may be more difficult to notice changes. When should I see my health care provider? See your health care provider if you notice:  A change in shape or size of your breasts or nipples.  A change in the skin of your breast or nipples, such as a reddened or scaly area.  Unusual discharge from your nipples.  A lump or thick area that was not there before.  Pain in your breasts.  Anything that concerns you.

## 2020-03-19 ENCOUNTER — Telehealth: Payer: Self-pay | Admitting: *Deleted

## 2020-03-19 LAB — CBC
Hematocrit: 47.2 % — ABNORMAL HIGH (ref 34.0–46.6)
Hemoglobin: 15.6 g/dL (ref 11.1–15.9)
MCH: 30.5 pg (ref 26.6–33.0)
MCHC: 33.1 g/dL (ref 31.5–35.7)
MCV: 92 fL (ref 79–97)
Platelets: 256 10*3/uL (ref 150–450)
RBC: 5.11 x10E6/uL (ref 3.77–5.28)
RDW: 12.3 % (ref 11.7–15.4)
WBC: 6.2 10*3/uL (ref 3.4–10.8)

## 2020-03-19 LAB — LIPID PANEL
Chol/HDL Ratio: 3.8 ratio (ref 0.0–4.4)
Cholesterol, Total: 196 mg/dL (ref 100–199)
HDL: 51 mg/dL (ref >39)
LDL Chol Calc (NIH): 108 mg/dL — ABNORMAL HIGH (ref 0–99)
Triglycerides: 213 mg/dL — ABNORMAL HIGH (ref 0–149)
VLDL Cholesterol Cal: 37 mg/dL (ref 5–40)

## 2020-03-19 LAB — COMPREHENSIVE METABOLIC PANEL
ALT: 28 IU/L (ref 0–32)
AST: 29 IU/L (ref 0–40)
Albumin/Globulin Ratio: 1.7 (ref 1.2–2.2)
Albumin: 4.5 g/dL (ref 3.8–4.9)
Alkaline Phosphatase: 107 IU/L (ref 44–121)
BUN/Creatinine Ratio: 14 (ref 9–23)
BUN: 10 mg/dL (ref 6–24)
Bilirubin Total: 0.8 mg/dL (ref 0.0–1.2)
CO2: 21 mmol/L (ref 20–29)
Calcium: 9.6 mg/dL (ref 8.7–10.2)
Chloride: 104 mmol/L (ref 96–106)
Creatinine, Ser: 0.72 mg/dL (ref 0.57–1.00)
GFR calc Af Amer: 107 mL/min/{1.73_m2} (ref >59)
GFR calc non Af Amer: 93 mL/min/{1.73_m2} (ref >59)
Globulin, Total: 2.7 g/dL (ref 1.5–4.5)
Glucose: 112 mg/dL — ABNORMAL HIGH (ref 65–99)
Potassium: 4.5 mmol/L (ref 3.5–5.2)
Sodium: 141 mmol/L (ref 134–144)
Total Protein: 7.2 g/dL (ref 6.0–8.5)

## 2020-03-19 LAB — VITAMIN D 25 HYDROXY (VIT D DEFICIENCY, FRACTURES): Vit D, 25-Hydroxy: 37.5 ng/mL (ref 30.0–100.0)

## 2020-03-19 LAB — HEMOGLOBIN A1C
Est. average glucose Bld gHb Est-mCnc: 140 mg/dL
Hgb A1c MFr Bld: 6.5 % — ABNORMAL HIGH (ref 4.8–5.6)

## 2020-03-19 LAB — TSH: TSH: 2.18 u[IU]/mL (ref 0.450–4.500)

## 2020-03-19 NOTE — Telephone Encounter (Signed)
Patient left voicemail asking for return call to provide PCP telephone & fax number for Dr. Oscar La.

## 2020-03-19 NOTE — Telephone Encounter (Signed)
Returned call to patient. Patient provided PCP information. PCP is Reece Leader, Cordelia Poche, NPI: [id number], with [facility]. Phone #: [phone] & fax #: [phone].   Routing to provider and will close encounter.

## 2020-03-19 NOTE — Telephone Encounter (Signed)
Attempted to reach patient. Voicemail full, unable to leave a message.

## 2020-03-20 LAB — CYTOLOGY - PAP: Diagnosis: NEGATIVE

## 2020-03-24 ENCOUNTER — Telehealth: Payer: Self-pay

## 2020-03-24 NOTE — Telephone Encounter (Signed)
Opened in error

## 2020-06-16 ENCOUNTER — Ambulatory Visit: Payer: BC Managed Care – PPO | Admitting: Sports Medicine

## 2020-06-24 ENCOUNTER — Other Ambulatory Visit: Payer: Self-pay

## 2020-06-24 ENCOUNTER — Ambulatory Visit: Payer: BC Managed Care – PPO | Admitting: Sports Medicine

## 2020-06-24 ENCOUNTER — Encounter: Payer: Self-pay | Admitting: Sports Medicine

## 2020-06-24 DIAGNOSIS — L603 Nail dystrophy: Secondary | ICD-10-CM

## 2020-06-24 DIAGNOSIS — E1142 Type 2 diabetes mellitus with diabetic polyneuropathy: Secondary | ICD-10-CM | POA: Diagnosis not present

## 2020-06-24 DIAGNOSIS — Q667 Congenital pes cavus, unspecified foot: Secondary | ICD-10-CM | POA: Diagnosis not present

## 2020-06-24 DIAGNOSIS — M2042 Other hammer toe(s) (acquired), left foot: Secondary | ICD-10-CM

## 2020-06-24 DIAGNOSIS — L853 Xerosis cutis: Secondary | ICD-10-CM

## 2020-06-24 DIAGNOSIS — E119 Type 2 diabetes mellitus without complications: Secondary | ICD-10-CM

## 2020-06-24 DIAGNOSIS — I739 Peripheral vascular disease, unspecified: Secondary | ICD-10-CM

## 2020-06-24 DIAGNOSIS — M2041 Other hammer toe(s) (acquired), right foot: Secondary | ICD-10-CM

## 2020-06-24 NOTE — Progress Notes (Signed)
Subjective: Nancy Lynch is a 60 y.o. female patient with history of diabetes who presents to office today for diabetic foot exam patient reports that she was diagnosed with diabetes mainly controlled by diet however does not know her last A1c or blood sugar and reports the previous medicine that her PCP tried to give her for the diabetes made her feel bad so it has been discontinued.  Patient reports that she does have some burning and throbbing in her feet and legs that is worse at night.  Patient reports that she also has some dry skin on the bottoms of both feet and has noticed her nails getting a little yellow.  Patient is also interested in diabetic shoes.  No other pedal complaints noted.  Review of systems noncontributory.  Patient Active Problem List   Diagnosis Date Noted  . Obstructive sleep apnea 10/24/2019  . Other chronic sinusitis 10/24/2019  . Genetic testing 08/14/2018  . Family history of ovarian cancer   . Family history of colon cancer   . Family history of prostate cancer   . Adjustment insomnia 09/18/2015  . Lumbar degenerative disc disease 05/05/2015  . High risk medication use 04/21/2015  . Impaired glucose tolerance 04/20/2015  . Mixed hyperlipidemia 04/20/2015  . Recurrent major depressive disorder, in remission (HCC) 04/20/2015  . Vitamin D deficiency 04/20/2015  . 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 Ascorbic Acid (VITAMIN C) 100 MG tablet Take by mouth.    . Cyanocobalamin (B-12) 100 MCG TABS Take by mouth.    Marland Kitchen LORazepam (ATIVAN) 0.5 MG tablet Take 1 mg by mouth daily as needed.    . Multiple Vitamin (MULTIVITAMIN) tablet Take 1 tablet by mouth daily.    Marland Kitchen VITAMIN D PO Take by mouth.    Marland Kitchen VYVANSE 30 MG capsule Take 30 mg by mouth daily.    Marland Kitchen zinc gluconate 50 MG tablet Take 50 mg by mouth daily.     Current Facility-Administered Medications on File Prior  to Visit  Medication Dose Route Frequency Provider Last Rate Last Admin  . triamcinolone acetonide (KENALOG) 10 MG/ML injection 10 mg  10 mg Other Once Asencion Islam, DPM      . triamcinolone acetonide (KENALOG) 10 MG/ML injection 10 mg  10 mg Other Once Asencion Islam, DPM       Allergies  Allergen Reactions  . Tramadol     Other reaction(s): Other (see comments) "Feels High"    No results found for this or any previous visit (from the past 2160 hour(s)).  Objective: General: Patient is awake, alert, and oriented x 3 and in no acute distress.  Integument: Skin is warm, dry and supple bilateral. Nails short and mildly discolored with areas of dystrophy bilateral, no signs of infection. No open lesions or preulcerative lesions present bilateral.  Dry skin plantar forefoot bilateral especially submit 1 bilateral.  Remaining integument unremarkable.  Vasculature:  Dorsalis Pedis pulse 1/4 bilateral. Posterior Tibial pulse 1/4 bilateral.  Capillary fill time <3 sec 1-5 bilateral. Positive hair growth to the level of the digits. Temperature gradient within normal limits.  Moderate varicosities present bilateral.  Trace edema present bilateral.   Neurology: The patient has intact sensation measured with a 5.07/10g Semmes Weinstein Monofilament at all pedal sites bilateral. Vibratory sensation slightly diminished bilateral with tuning fork. No Babinski sign present bilateral.   Musculoskeletal: Pes cavus foot type noted bilateral  with lesser hammertoe contracture fourth and fifth toes pedal deformities noted bilateral. Muscular strength 5/5 in all lower extremity muscular groups bilateral without pain on range of motion . No tenderness with calf compression bilateral.  Assessment and Plan: Problem List Items Addressed This Visit   None   Visit Diagnoses    Comprehensive diabetic foot examination, type 2 DM, encounter for [facility])    -  Primary   Diabetic polyneuropathy associated with type  2 diabetes mellitus (HCC)       Relevant Medications   VYVANSE 30 MG capsule   LORazepam (ATIVAN) 0.5 MG tablet   Pes cavus       Hammer toes of both feet       Nail dystrophy       Dry skin       PVD (peripheral vascular disease) (HCC)           -Examined patient. -Discussed and educated patient on diabetic foot care, especially with  regards to the vascular, neurological and musculoskeletal systems.  -Stressed the importance of good glycemic control and the detriment of not  controlling glucose levels in relation to the foot. -Advised patient to try topical Voltaren for pain in her legs that is worse at night likely could be related to circulation or neuropathy -Advised patient to get over-the-counter tea tree oil to use at bedtime to toenails for nail discoloration -Advised patient to use foot miracle cream for dry skin: Sample provided at today's visit -Answered all patient questions -Patient to return for diabetic shoe measurements -Patient advised to call the office if any problems or questions arise in the meantime.  Asencion Islam, DPM

## 2020-06-24 NOTE — Patient Instructions (Signed)
Get OTC tea tree oil to use on your toe nails for discoloration after your bath or shower Get OTC voltaren gel to use on your feet or legs for pain at night Recommend foot miracle cream for dry skin on bottoms of feet

## 2020-07-16 ENCOUNTER — Telehealth: Payer: Self-pay | Admitting: Sports Medicine

## 2020-07-16 NOTE — Telephone Encounter (Signed)
Per Ulice Dash @ [employer] [place] diabetic shoes/inserts(A5500/A5514) are not covered but the L3020 Orthotic is valid and billable and no Josem Kaufmann is required. Covered @ 100% of allowable after 60.00 copay is paid. Out of pocket is 6000.00(met 155.73) based on medical necessity.. reference #[id number]

## 2020-07-16 NOTE — Telephone Encounter (Signed)
Pt is scheduled to see EJ tomorrow and I called her to let her know that her insurance does not cover diabetic shoes and inserts but does cover orthotics. She is bringing a pr of shoes that she will like the orthotics to fit in them. She asked if the orthotics could go in crocs and I advised against that as they makd her foot slide. Also advised if she would like a shorter pair so it could fit in different shoes we could discuss that as well.

## 2020-07-17 ENCOUNTER — Ambulatory Visit: Payer: BC Managed Care – PPO | Admitting: Sports Medicine

## 2020-07-17 ENCOUNTER — Telehealth: Payer: Self-pay | Admitting: Sports Medicine

## 2020-07-17 ENCOUNTER — Other Ambulatory Visit: Payer: BC Managed Care – PPO

## 2020-07-17 NOTE — Telephone Encounter (Signed)
Pt left message asking what insurances would pay for her to get diabetic shoes. She was scheduled today but canceled the appt as her [employer] does not cover diabetic shoes.   I returned call and her voicemail is full but medicare is the main insurance that covers diabetic shoes. Other wise she would have to verify with insurance company. Not many commercial insurances covers diabetic shoes.

## 2021-03-30 NOTE — Progress Notes (Signed)
61 y.o. [id number] Married Unavailable Not Hispanic or Latino female here for annual exam.  No vaginal bleeding. Sexually active, no pain.   Her son died in September 30, 2022, her best fried died in 12/31/2022, her neighbor died in 11/30/22. She has some strain in her marriage.   She c/o a 1 week h/o worsening urinary frequency and dysuria. No fever, no flank pain. She does have lower back pain.   She c/o a several month h/o intermittent breast pain, it can be either breast or under her arms. Left is worse than the right.  No major weight changes. She has well fitting bra's.  She is drinking up to 4 cups of coffee a day.   H/O OAB, tolerable. No leakage.   She is a diabetic, recent HgbA1C was in a prediabetic level    No LMP recorded. Patient has had an ablation.          Sexually active: Yes.    The current method of family planning is post menopausal status.    Exercising: Yes.     Walking  Smoker:  no  Health Maintenance: Pap:  03/25/19 Neg             03/08/18 Neg:neg HR HPV History of abnormal Pap:   Yes, 1983 Cryo surgery MMG:  08/17/20 Bi-rads 1 neg  BMD:   none Colonoscopy: years ago per patient  TDaP:  unsure  Gardasil: n/a    reports that she has never smoked. She has never used smokeless tobacco. She reports that she does not drink alcohol and does not use drugs. She works in [employer]. 1 living son and one 14 year old granddaughter.  Her son died in Sep 30, 2022 of an abscessed tooth, he was 68.   Past Medical History:  Diagnosis Date   Diabetes mellitus without complication ([place])    Family history of colon cancer    Family history of ovarian cancer    Family history of prostate cancer    History of uterine fibroid     Past Surgical History:  Procedure Laterality Date   ENDOMETRIAL ABLATION     GYNECOLOGIC CRYOSURGERY     NASAL SINUS SURGERY     SKIN GRAFT     TUBAL LIGATION     TUMOR REMOVAL     on thigh, benign per patient    Current Outpatient Medications  Medication Sig  Dispense Refill   LORazepam (ATIVAN) 0.5 MG tablet Take 1 mg by mouth daily as needed.     Multiple Vitamin (MULTIVITAMIN) tablet Take 1 tablet by mouth daily.     VYVANSE 30 MG capsule Take 30 mg by mouth daily.     furosemide (LASIX) 20 MG tablet Take by mouth.     Current Facility-Administered Medications  Medication Dose Route Frequency Provider Last Rate Last Admin   triamcinolone acetonide (KENALOG) 10 MG/ML injection 10 mg  10 mg Other Once Landis Martins, DPM       triamcinolone acetonide (KENALOG) 10 MG/ML injection 10 mg  10 mg Other Once Landis Martins, DPM        Family History  Problem Relation Age of Onset   COPD Mother    Osteoporosis Mother    Heart attack Father    Ovarian cancer Sister        dx 68s   Heart attack Brother    Cancer Brother        "back cancer, sinus cancer and prostate cancer"   Colon cancer Paternal Grandfather  Cancer Maternal Grandfather        unk type    Review of Systems  Exam:   BP 110/72    Pulse 63    Ht 5\' 4"  (1.626 m)    Wt 204 lb (92.5 kg)    SpO2 99%    BMI 35.02 kg/m   Weight change: @WEIGHTCHANGE @ Height:   Height: 5\' 4"  (162.6 cm)  Ht Readings from Last 3 Encounters:  04/05/21 5\' 4"  (1.626 m)  03/18/20 5\' 4"  (1.626 m)  04/09/19 5' 4.5" (1.638 m)    General appearance: alert, cooperative and appears stated age Head: Normocephalic, without obvious abnormality, atraumatic Neck: no adenopathy, supple, symmetrical, trachea midline and thyroid normal to inspection and palpation Lungs: clear to auscultation bilaterally Cardiovascular: regular rate and rhythm Breasts: normal appearance, no masses or tenderness Abdomen: soft, non-tender; non distended,  no masses,  no organomegaly Extremities: extremities normal, atraumatic, no cyanosis or edema Skin: Skin color, texture, turgor normal. No rashes or lesions Lymph nodes: Cervical, supraclavicular, and axillary nodes normal. No abnormal inguinal nodes palpated Neurologic:  Grossly normal   Pelvic: External genitalia:  no lesions              Urethra:  normal appearing urethra with no masses, tenderness or lesions              Bartholins and Skenes: normal                 Vagina: normal appearing vagina with normal color and discharge, no lesions              Cervix: no lesions               Bimanual Exam:  Uterus:   no masses or tenderness              Adnexa: no mass, fullness, tenderness               Rectovaginal: Confirms               Anus:  normal sphincter tone, no lesions  Gae Dry chaperoned for the exam.  1. Well woman exam Discussed breast self exam Discussed calcium and vit D intake Screening labs with primary  2. Dysuria Urinalysis doesn't look very concerning, will send culture - Urinalysis, Complete - Urine Culture  3. Breast pain Cut back on caffeine, make sure her bra's are well  4. Screening for cervical cancer Patient desires yearly paps - Cytology - PAP  5. History of diabetes mellitus Requests blood work - HgB A1c  6. Colon cancer screening - Ambulatory referral to Gastroenterology

## 2021-03-31 ENCOUNTER — Ambulatory Visit: Payer: BC Managed Care – PPO | Admitting: Obstetrics and Gynecology

## 2021-04-05 ENCOUNTER — Encounter: Payer: Self-pay | Admitting: Obstetrics and Gynecology

## 2021-04-05 ENCOUNTER — Ambulatory Visit (INDEPENDENT_AMBULATORY_CARE_PROVIDER_SITE_OTHER): Payer: BC Managed Care – PPO | Admitting: Obstetrics and Gynecology

## 2021-04-05 ENCOUNTER — Other Ambulatory Visit (HOSPITAL_COMMUNITY)
Admission: RE | Admit: 2021-04-05 | Discharge: 2021-04-05 | Disposition: A | Discharge status: Home / Self Care | Payer: BC Managed Care – PPO | Source: Ambulatory Visit | Attending: Obstetrics and Gynecology | Admitting: Obstetrics and Gynecology

## 2021-04-05 ENCOUNTER — Other Ambulatory Visit: Payer: Self-pay

## 2021-04-05 VITALS — BP 110/72 | HR 63 | Ht 64.0 in | Wt 204.0 lb

## 2021-04-05 DIAGNOSIS — N644 Mastodynia: Secondary | ICD-10-CM

## 2021-04-05 DIAGNOSIS — R300 Dysuria: Secondary | ICD-10-CM

## 2021-04-05 DIAGNOSIS — Z124 Encounter for screening for malignant neoplasm of cervix: Secondary | ICD-10-CM | POA: Diagnosis present

## 2021-04-05 DIAGNOSIS — Z01419 Encounter for gynecological examination (general) (routine) without abnormal findings: Secondary | ICD-10-CM

## 2021-04-05 DIAGNOSIS — Z8639 Personal history of other endocrine, nutritional and metabolic disease: Secondary | ICD-10-CM

## 2021-04-05 DIAGNOSIS — Z1211 Encounter for screening for malignant neoplasm of colon: Secondary | ICD-10-CM

## 2021-04-05 LAB — URINALYSIS, COMPLETE
Bacteria, UA: NONE SEEN /HPF
Bilirubin Urine: NEGATIVE
Casts: NONE SEEN /LPF
Crystals: NONE SEEN /HPF
Glucose, UA: NEGATIVE
Hgb urine dipstick: NEGATIVE
Hyaline Cast: NONE SEEN /LPF
Ketones, ur: NEGATIVE
Nitrite: NEGATIVE
Protein, ur: NEGATIVE
RBC / HPF: NONE SEEN /HPF (ref 0–2)
Specific Gravity, Urine: 1.01 (ref 1.001–1.035)
Yeast: NONE SEEN /HPF
pH: 7 (ref 5.0–8.0)

## 2021-04-05 NOTE — Patient Instructions (Addendum)
EXERCISE   We recommended that you start or continue a regular exercise program for good health. Physical activity is anything that gets your body moving, some is better than none. The CDC recommends 150 minutes per week of Moderate-Intensity Aerobic Activity and 2 or more days of Muscle Strengthening Activity. ° °Benefits of exercise are limitless: helps weight loss/weight maintenance, improves mood and energy, helps with depression and anxiety, improves sleep, tones and strengthens muscles, improves balance, improves bone density, protects from chronic conditions such as heart disease, high blood pressure and diabetes and so much more. °To learn more visit: https://www.cdc.gov/physicalactivity/index.html ° °DIET: Good nutrition starts with a healthy diet of fruits, vegetables, whole grains, and lean protein sources. Drink plenty of water for hydration. Minimize empty calories, sodium, sweets. For more information about dietary recommendations visit: https://health.gov/our-work/nutrition-physical-activity/dietary-guidelines and https://www.myplate.gov/ ° °ALCOHOL:  Women should limit their alcohol intake to no more than 7 drinks/beers/glasses of wine (combined, not each!) per week. Moderation of alcohol intake to this level decreases your risk of breast cancer and liver damage.  If you are concerned that you may have a problem, or your friends have told you they are concerned about your drinking, there are many resources to help. A well-known program that is free, effective, and available to all people all over the nation is Alcoholics Anonymous.  Check out this site to learn more: https://www.aa.org/ ° ° °CALCIUM AND VITAMIN D:  Adequate intake of calcium and Vitamin D are recommended for bone health.  You should be getting between 1000-1200 mg of calcium and 800 units of Vitamin D daily between diet and supplements ° °PAP SMEARS:  Pap smears, to check for cervical cancer or precancers,  have traditionally been  done yearly, scientific advances have shown that most women can have pap smears less often.  However, every woman still should have a physical exam from her gynecologist every year. It will include a breast check, inspection of the vulva and vagina to check for abnormal growths or skin changes, a visual exam of the cervix, and then an exam to evaluate the size and shape of the uterus and ovaries. We will also provide age appropriate advice regarding health maintenance, like when you should have certain vaccines, screening for sexually transmitted diseases, bone density testing, colonoscopy, mammograms, etc.  ° °MAMMOGRAMS:  All women over 40 years old should have a routine mammogram.  ° °COLON CANCER SCREENING: Now recommend starting at age 45. At this time colonoscopy is not covered for routine screening until 50. There are take home tests that can be done between 45-49.  ° °COLONOSCOPY:  Colonoscopy to screen for colon cancer is recommended for all women at age 50.  We know, you hate the idea of the prep.  We agree, BUT, having colon cancer and not knowing it is worse!!  Colon cancer so often starts as a polyp that can be seen and removed at colonscopy, which can quite literally save your life!  And if your first colonoscopy is normal and you have no family history of colon cancer, most women don't have to have it again for 10 years.  Once every ten years, you can do something that may end up saving your life, right?  We will be happy to help you get it scheduled when you are ready.  Be sure to check your insurance coverage so you understand how much it will cost.  It may be covered as a preventative service at no cost, but you should check   your particular policy.      Breast Self-Awareness Breast self-awareness means being familiar with how your breasts look and feel. It involves checking your breasts regularly and reporting any changes to your health care provider. Practicing breast self-awareness is  important. A change in your breasts can be a sign of a serious medical problem. Being familiar with how your breasts look and feel allows you to find any problems early, when treatment is more likely to be successful. All women should practice breast self-awareness, including women who have had breast implants. How to do a breast self-exam One way to learn what is normal for your breasts and whether your breasts are changing is to do a breast self-exam. To do a breast self-exam: Look for Changes  Remove all the clothing above your waist. Stand in front of a mirror in a room with good lighting. Put your hands on your hips. Push your hands firmly downward. Compare your breasts in the mirror. Look for differences between them (asymmetry), such as: Differences in shape. Differences in size. Puckers, dips, and bumps in one breast and not the other. Look at each breast for changes in your skin, such as: Redness. Scaly areas. Look for changes in your nipples, such as: Discharge. Bleeding. Dimpling. Redness. A change in position. Feel for Changes Carefully feel your breasts for lumps and changes. It is best to do this while lying on your back on the floor and again while sitting or standing in the shower or tub with soapy water on your skin. Feel each breast in the following way: Place the arm on the side of the breast you are examining above your head. Feel your breast with the other hand. Start in the nipple area and make  inch (2 cm) overlapping circles to feel your breast. Use the pads of your three middle fingers to do this. Apply light pressure, then medium pressure, then firm pressure. The light pressure will allow you to feel the tissue closest to the skin. The medium pressure will allow you to feel the tissue that is a little deeper. The firm pressure will allow you to feel the tissue close to the ribs. Continue the overlapping circles, moving downward over the breast until you feel your  ribs below your breast. Move one finger-width toward the center of the body. Continue to use the  inch (2 cm) overlapping circles to feel your breast as you move slowly up toward your collarbone. Continue the up and down exam using all three pressures until you reach your armpit.  Write Down What You Find  Write down what is normal for each breast and any changes that you find. Keep a written record with breast changes or normal findings for each breast. By writing this information down, you do not need to depend only on memory for size, tenderness, or location. Write down where you are in your menstrual cycle, if you are still menstruating. If you are having trouble noticing differences in your breasts, do not get discouraged. With time you will become more familiar with the variations in your breasts and more comfortable with the exam. How often should I examine my breasts? Examine your breasts every month. If you are breastfeeding, the best time to examine your breasts is after a feeding or after using a breast pump. If you menstruate, the best time to examine your breasts is 5-7 days after your period is over. During your period, your breasts are lumpier, and it may be more   difficult to notice changes. When should I see my health care provider? See your health care provider if you notice: A change in shape or size of your breasts or nipples. A change in the skin of your breast or nipples, such as a reddened or scaly area. Unusual discharge from your nipples. A lump or thick area that was not there before. Pain in your breasts. Anything that concerns you. Breast Tenderness Breast tenderness is a common problem for women of all ages, but may also occur in men. Breast tenderness may range from mild discomfort to severe pain. In women, the pain usually comes and goes with the menstrual cycle, but it can also be constant. Breast tenderness has many possible causes, including hormone changes,  infections, and taking certain medicines. You may have tests, such as a mammogram or an ultrasound, to check for any unusual findings. Having breast tenderness usually does not mean that you have breast cancer. Follow these instructions at home: Managing pain and discomfort  If directed, put ice to the painful area. To do this: Put ice in a plastic bag. Place a towel between your skin and the bag. Leave the ice on for 20 minutes, 2-3 times a day. Wear a supportive bra, especially during exercise. You may also want to wear a supportive bra while sleeping if your breasts are very tender. Medicines Take over-the-counter and prescription medicines only as told by your health care provider. If the cause of your pain is infection, you may be prescribed an antibiotic medicine. If you were prescribed an antibiotic, take it as told by your health care provider. Do not stop taking the antibiotic even if you start to feel better. Eating and drinking Your health care provider may recommend that you lessen the amount of fat in your diet. You can do this by: Limiting fried foods. Cooking foods using methods such as baking, boiling, grilling, and broiling. Decrease the amount of caffeine in your diet. Instead, drink more water and choose caffeine-free drinks. General instructions  Keep a log of the days and times when your breasts are most tender. Ask your health care provider how to do breast exams at home. This will help you notice if you have an unusual growth or lump. Keep all follow-up visits as told by your health care provider. This is important. Contact a health care provider if: Any part of your breast is hard, red, and hot to the touch. This may be a sign of infection. You are a woman and: Not breastfeeding and you have fluid, especially blood or pus, coming out of your nipples. Have a new or painful lump in your breast that remains after your menstrual period ends. You have a fever. Your pain  does not improve or it gets worse. Your pain is interfering with your daily activities. Summary Breast tenderness may range from mild discomfort to severe pain. Breast tenderness has many possible causes, including hormone changes, infections, and taking certain medicines. It can be treated with ice, wearing a supportive bra, and medicines. Make changes to your diet if told to by your health care provider. This information is not intended to replace advice given to you by your health care provider. Make sure you discuss any questions you have with your health care provider. Document Revised: 06/18/2018 Document Reviewed: 06/18/2018 Elsevier Patient Education  [place] can also take over the counter evening primrose oil and vit E.

## 2021-04-06 ENCOUNTER — Telehealth: Payer: Self-pay

## 2021-04-06 DIAGNOSIS — N644 Mastodynia: Secondary | ICD-10-CM

## 2021-04-06 NOTE — Telephone Encounter (Signed)
Nancy Bolk, MD  [facility] Triage Patient with bilateral breast pain. Please set her up for diagnostic bilateral breast imaging.  Orders in.

## 2021-04-06 NOTE — Telephone Encounter (Signed)
FYI. Appt has been made for 04/28/21 @ 2pm at [facility]. Pt notified and voiced understanding.

## 2021-04-07 LAB — URINE CULTURE

## 2021-04-07 LAB — CYTOLOGY - PAP: Diagnosis: NEGATIVE

## 2021-04-07 LAB — HEMOGLOBIN A1C
Est. average glucose Bld gHb Est-mCnc: 143 mg/dL
Hgb A1c MFr Bld: 6.6 % — ABNORMAL HIGH (ref 4.8–5.6)

## 2021-04-12 ENCOUNTER — Telehealth: Payer: Self-pay

## 2021-04-12 NOTE — Telephone Encounter (Signed)
She can try over the counter evening primrose oil and vit E for breast pain (it is in her visit summary in case she forgets) ?

## 2021-04-12 NOTE — Telephone Encounter (Signed)
No answer and VM full, unable to leave msg. Will try again later.  ?

## 2021-04-12 NOTE — Telephone Encounter (Signed)
Pt calling re: the supplements you recommended for her at her recent visit. Reports she remembers there were two. She says she knows one of them was "primrose" but is not sure of the other. Please advise. Thanks.  ?

## 2021-04-13 NOTE — Telephone Encounter (Signed)
Pt notified and voiced understanding 

## 2021-04-28 ENCOUNTER — Other Ambulatory Visit: Payer: BC Managed Care – PPO

## 2021-05-24 ENCOUNTER — Ambulatory Visit: Payer: BC Managed Care – PPO

## 2021-05-24 ENCOUNTER — Ambulatory Visit
Admission: RE | Admit: 2021-05-24 | Discharge: 2021-05-24 | Disposition: A | Discharge status: Home / Self Care | Payer: BC Managed Care – PPO | Source: Ambulatory Visit | Attending: Obstetrics and Gynecology | Admitting: Obstetrics and Gynecology

## 2021-05-24 DIAGNOSIS — N644 Mastodynia: Secondary | ICD-10-CM

## 2021-06-29 ENCOUNTER — Telehealth: Payer: Self-pay

## 2021-06-29 ENCOUNTER — Ambulatory Visit: Payer: BC Managed Care – PPO | Admitting: Radiology

## 2021-06-29 NOTE — Telephone Encounter (Signed)
No show for PV. Mailbox was full unable to leave a message. Patient was called three times. A no show letter will be mailed after 5:00 today. Procedure will be cancelled per [place] guidelines.

## 2021-07-07 ENCOUNTER — Ambulatory Visit (AMBULATORY_SURGERY_CENTER): Payer: BC Managed Care – PPO | Admitting: *Deleted

## 2021-07-07 VITALS — Ht 64.0 in | Wt 199.0 lb

## 2021-07-07 DIAGNOSIS — Z1211 Encounter for screening for malignant neoplasm of colon: Secondary | ICD-10-CM

## 2021-07-07 NOTE — Progress Notes (Signed)
Patient's pre-visit was done today over the phone with the patient. Name,DOB and address verified. Patient denies any allergies to Eggs and Soy. Patient denies any problems with anesthesia/sedation. Patient is not taking blood thinners. No home Oxygen. Insurance confirmed with patient.  PV done today, but patient is taking Phentermine-colonoscopy cancelled per policy. Pt aware she will need to be off the phentermine for 10 days prior. Pt will call us back Monday for August schedule-she wants a morning time. Patient is aware of our care-partner policy.

## 2021-07-09 ENCOUNTER — Telehealth: Payer: Self-pay | Admitting: *Deleted

## 2021-07-09 DIAGNOSIS — Z1211 Encounter for screening for malignant neoplasm of colon: Secondary | ICD-10-CM

## 2021-07-09 NOTE — Telephone Encounter (Signed)
Called patient scheduled colon for Aug. 2. New prep instructions mailed to patient.-pt aware. Pt aware to stop taking phentermine 10 days prior to colonoscopy.

## 2021-07-09 NOTE — Addendum Note (Signed)
Addended by: Lucia Gaskins on: 07/09/2021 12:58 PM   Modules accepted: Orders

## 2021-07-12 ENCOUNTER — Encounter: Payer: BC Managed Care – PPO | Admitting: Internal Medicine

## 2021-08-24 ENCOUNTER — Encounter: Payer: Self-pay | Admitting: Allergy

## 2021-08-24 ENCOUNTER — Ambulatory Visit: Payer: BC Managed Care – PPO | Admitting: Allergy

## 2021-08-24 VITALS — BP 126/82 | HR 73 | Resp 16 | Ht 63.0 in | Wt 191.6 lb

## 2021-08-24 DIAGNOSIS — K9049 Malabsorption due to intolerance, not elsewhere classified: Secondary | ICD-10-CM | POA: Diagnosis not present

## 2021-08-24 DIAGNOSIS — J3089 Other allergic rhinitis: Secondary | ICD-10-CM

## 2021-08-24 DIAGNOSIS — H1013 Acute atopic conjunctivitis, bilateral: Secondary | ICD-10-CM | POA: Diagnosis not present

## 2021-08-24 MED ORDER — RYALTRIS 665-25 MCG/ACT NA SUSP: NASAL | 5 refills | Status: DC

## 2021-08-24 NOTE — Patient Instructions (Signed)
-   Testing today showed: trees, outdoor molds, and dust mites. - Copy of test results provided.  - Avoidance measures provided. - Start taking: Xyzal (levocetirizine) 5mg  tablet once daily.  This is a long-acting antihistamine for general allergy symptom control.  Ryaltris (olopatadine/mometasone) two sprays per nostril 1-2 times daily as needed for runny or stuffy nose.  Sample provided.   Pataday (olopatadine) one drop per eye daily as needed itchy/watery eyes.   - You can use an extra dose of the antihistamine, if needed, for breakthrough symptoms.  - Consider nasal saline rinses 1-2 times daily to remove allergens from the nasal cavities as well as help with mucous clearance (this is especially helpful to do before the nasal sprays are given) - Consider allergy shots as a means of long-term control. - Allergy shots "re-train" and "reset" the immune system to ignore environmental allergens and decrease the resulting immune response to those allergens (sneezing, itchy watery eyes, runny nose, nasal congestion, etc).    - Allergy shots improve symptoms in 80-85% of patients.  - We can discuss more at the future appointment if the medications are not working for you.  - testing for dairy (milk) and gluten (wheat) are negative thus not concerned for food allergy  Follow-up in 4-6 months or sooner if needed
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
## 2021-09-08 ENCOUNTER — Encounter: Payer: BC Managed Care – PPO | Admitting: Internal Medicine

## 2021-09-20 ENCOUNTER — Ambulatory Visit (AMBULATORY_SURGERY_CENTER): Payer: BC Managed Care – PPO

## 2021-09-20 VITALS — Ht 64.0 in | Wt 192.0 lb

## 2021-09-20 DIAGNOSIS — Z1211 Encounter for screening for malignant neoplasm of colon: Secondary | ICD-10-CM

## 2021-09-20 NOTE — Progress Notes (Signed)
No egg or soy allergy known to patient   No issues known to pt with past sedation with any surgeries or procedures  Patient denies ever being told they had issues or difficulty with intubation   No FH of Malignant Hyperthermia  Pt is not on diet pills  Pt is not on  home 02   Pt is not on blood thinners   Pt has occasional issues with constipation, instruct pt to take one capful of miralax in a cup of juice or water daily for 5 days before her procedure.  No A fib or A flutter  Have any cardiac testing pending--no  Miralax prep plus 5 days before the procedure.

## 2021-10-18 ENCOUNTER — Encounter: Payer: BC Managed Care – PPO | Admitting: Internal Medicine

## 2021-12-14 ENCOUNTER — Encounter: Payer: Self-pay | Admitting: Allergy

## 2021-12-14 ENCOUNTER — Ambulatory Visit: Payer: BC Managed Care – PPO | Admitting: Allergy

## 2021-12-14 VITALS — BP 124/82 | HR 86 | Resp 16

## 2021-12-14 DIAGNOSIS — J3089 Other allergic rhinitis: Secondary | ICD-10-CM

## 2021-12-14 DIAGNOSIS — H1013 Acute atopic conjunctivitis, bilateral: Secondary | ICD-10-CM | POA: Diagnosis not present

## 2021-12-14 DIAGNOSIS — K9049 Malabsorption due to intolerance, not elsewhere classified: Secondary | ICD-10-CM | POA: Diagnosis not present

## 2021-12-14 NOTE — Patient Instructions (Addendum)
-   Continue avoidance measures for trees, outdoor molds, and dust mites. - Start taking: Allegra 1 tablet once daily.   Ryaltris (olopatadine/mometasone) two sprays per nostril 1-2 times daily as needed for runny or stuffy nose.  Sample provided.   Pataday (olopatadine) one drop per eye daily as needed itchy/watery eyes.   - You can use an extra dose of the antihistamine, if needed, for breakthrough symptoms.  - Consider nasal saline rinses 1-2 times daily to remove allergens from the nasal cavities as well as help with mucous clearance (this is especially helpful to do before the nasal sprays are given) - Consider allergy shots as a means of long-term control. - Allergy shots "re-train" and "reset" the immune system to ignore environmental allergens and decrease the resulting immune response to those allergens (sneezing, itchy watery eyes, runny nose, nasal congestion, etc).    - Allergy shots improve symptoms in 80-85% of patients.   - Testing for dairy (milk) and gluten (wheat) are negative thus not concerned for food allergy  Follow-up in 6-12 months or sooner if needed

## 2021-12-14 NOTE — Progress Notes (Signed)
Follow-up Note  RE: Nancy Lynch MRN: [id number] DOB: 26-Nov-1960 Date of Office Visit: 12/14/2021   History of present illness: Nancy Lynch is a 61 y.o. female presenting today for follow-up of allergic rhinitis with conjunctivitis and food intolerance.  She was last seen in the office on 08/25/2021 by myself.  She has not had any major health changes, surgeries or hospitalizations since this visit. She states the Ryaltris nasal spray has been very helpful.  She states if she is having a headache she will use it and it helps to get rid of her headache.  She did try the Xyzal but she states it made her sleepy.  She is not sure if it helped and states "maybe" if there.  She feels that Delma Freeze has been the most effective antihistamine for her as far.  She has not needed to use eyedrop like Pataday. She had negative testing for dairy and wheat products at the initial visit.  These are not foods that she eats regularly in her diet at this point.  Review of systems in the past 4 weeks: Review of Systems  Constitutional: Negative.   HENT: Negative.    Eyes: Negative.   Respiratory: Negative.    Cardiovascular: Negative.   Gastrointestinal: Negative.   Musculoskeletal: Negative.   Skin: Negative.   Allergic/Immunologic: Negative.   Neurological: Negative.      All other systems negative unless noted above in HPI  Past medical/social/surgical/family history have been reviewed and are unchanged unless specifically indicated below.  No changes  Medication List: Current Outpatient Medications  Medication Sig Dispense Refill   LORazepam (ATIVAN) 0.5 MG tablet Take 0.5 mg by mouth every 8 (eight) hours as needed for anxiety.     phentermine (ADIPEX-P) 37.5 MG tablet Take 37.5 mg by mouth every morning.     Olopatadine-Mometasone (RYALTRIS) [id number] MCG/ACT SUSP two sprays per nostril 1-2 times daily as needed (Patient not taking: Reported on 09/20/2021) 29 g 5   No current  facility-administered medications for this visit.     Known medication allergies: Allergies  Allergen Reactions   Tramadol     Other reaction(s): Other (see comments) "Feels High"     Physical examination: Blood pressure 124/82, pulse 86, resp. rate 16, SpO2 95 %.  General: Alert, interactive, in no acute distress. HEENT: PERRLA, TMs pearly gray, turbinates non-edematous without discharge, post-pharynx non erythematous. Neck: Supple without lymphadenopathy. Lungs: Clear to auscultation without wheezing, rhonchi or rales. {no increased work of breathing. CV: Normal S1, S2 without murmurs. Abdomen: Nondistended, nontender. Skin: Warm and dry, without lesions or rashes. Extremities:  No clubbing, cyanosis or edema. Neuro:   Grossly intact.  Diagnositics/Labs: None today  Assessment and plan: Allergic rhinitis with conjunctivitis  - Continue avoidance measures for trees, outdoor molds, and dust mites. - Start taking: Allegra 1 tablet once daily.   Ryaltris (olopatadine/mometasone) two sprays per nostril 1-2 times daily as needed for runny or stuffy nose.  Sample provided.   Pataday (olopatadine) one drop per eye daily as needed itchy/watery eyes.   - You can use an extra dose of the antihistamine, if needed, for breakthrough symptoms.  - Consider nasal saline rinses 1-2 times daily to remove allergens from the nasal cavities as well as help with mucous clearance (this is especially helpful to do before the nasal sprays are given) - Consider allergy shots as a means of long-term control. - Allergy shots "re-train" and "reset" the immune system to ignore environmental allergens  and decrease the resulting immune response to those allergens (sneezing, itchy watery eyes, runny nose, nasal congestion, etc).    - Allergy shots improve symptoms in 80-85% of patients.   Food intolerance - Testing for dairy (milk) and gluten (wheat) were negative at initial visit thus not concerned for  food allergy  Follow-up in 6-12 months or sooner if needed I appreciate the opportunity to take part in Nancy Lynch's care. Please do not hesitate to contact me with questions.  Sincerely,   Margo Aye, MD Allergy/Immunology [facility]

## 2022-04-12 NOTE — Progress Notes (Signed)
62 y.o. G70P2001 Married Unavailable Not Hispanic or Latino female here for annual exam.  Patient states that she wants a pap.   No vaginal bleeding.   She was started on Ozempic and has some constipation issues.     No LMP recorded. Patient has had an ablation.          Sexually active: No.  The current method of family planning is post menopausal status.    Exercising: No.  The patient does not participate in regular exercise at present. Smoker:  no  Health Maintenance: Pap:  04/05/21 WNL  03/25/19 Neg 03/08/18 Neg:neg HR HPV History of abnormal Pap:   Yes, 1983 Cryo surgery  MMG: 05/24/21 Bi-Rads cat 1: neg  BMD:   none Colonoscopy: she states UTD with her primary. She will f/u there.  TDaP:  unsure  Gardasil: n/a    reports that she has never smoked. She has never used smokeless tobacco. She reports that she does not currently use alcohol. She reports that she does not use drugs. She works in Press [employer]. Her 64 year old son died in 10-10-22. Her other son is 37. She has a 43 year old granddaughter.   Past Medical History:  Diagnosis Date   Anxiety    Depression    Diabetes mellitus without complication ([place])    no meds   Family history of colon cancer    Family history of ovarian cancer    Family history of prostate cancer    History of uterine fibroid    Sleep apnea    does not use CPAP    Past Surgical History:  Procedure Laterality Date   COLONOSCOPY  2007   in [place], [place]-polyps per pt   COLONOSCOPY     in [place], [place]-normal exam   ENDOMETRIAL ABLATION     GYNECOLOGIC CRYOSURGERY     NASAL SINUS SURGERY     SKIN GRAFT     TUBAL LIGATION     TUMOR REMOVAL     on thigh, benign per patient    Current Outpatient Medications  Medication Sig Dispense Refill   Olopatadine-Mometasone (RYALTRIS) 665-25 MCG/ACT SUSP two sprays per nostril 1-2 times daily as needed 29 g 5   OZEMPIC, 1 MG/DOSE, 4 MG/3ML SOPN Inject 1 mg into the skin once a week.     LORazepam  (ATIVAN) 0.5 MG tablet Take 0.5 mg by mouth every 8 (eight) hours as needed for anxiety. (Patient not taking: Reported on 04/19/2022)     No current facility-administered medications for this visit.    Family History  Problem Relation Age of Onset   Allergic rhinitis Mother    COPD Mother    Osteoporosis Mother    Heart attack Father    Ovarian cancer Sister        dx 92s   Prostate cancer Brother    Heart attack Brother    Cancer Brother        "back cancer, sinus cancer and prostate cancer"   Cancer Maternal Grandfather        unk type   Colon cancer Paternal Grandfather    Colon polyps Neg Hx    Esophageal cancer Neg Hx    Rectal cancer Neg Hx    Stomach cancer Neg Hx   Patient with negative genetic testing.   Review of Systems  All other systems reviewed and are negative.   Exam:   BP 122/74   Pulse 64   Ht 5' 3.5" (1.613  m)   Wt 203 lb (92.1 kg)   SpO2 100%   BMI 35.40 kg/m   Weight change: @WEIGHTCHANGE @ Height:   Height: 5' 3.5" (161.3 cm)  Ht Readings from Last 3 Encounters:  04/19/22 5' 3.5" (1.613 m)  09/20/21 5\' 4"  (1.626 m)  08/24/21 5\' 3"  (1.6 m)    General appearance: alert, cooperative and appears stated age Head: Normocephalic, without obvious abnormality, atraumatic Neck: no adenopathy, supple, symmetrical, trachea midline and thyroid normal to inspection and palpation Lungs: clear to auscultation bilaterally Cardiovascular: regular rate and rhythm Breasts: normal appearance, no masses or tenderness Abdomen: soft, non-tender; non distended,  no masses,  no organomegaly Extremities: extremities normal, atraumatic, no cyanosis or edema Skin: Skin color, texture, turgor normal. No rashes or lesions Lymph nodes: Cervical, supraclavicular, and axillary nodes normal. No abnormal inguinal nodes palpated Neurologic: Grossly normal   Pelvic: External genitalia:  no lesions              Urethra:  normal appearing urethra with no masses, tenderness or  lesions              Bartholins and Skenes: normal                 Vagina: normal appearing vagina with normal color and discharge, no lesions              Cervix: no lesions               Bimanual Exam:  Uterus:   no masses or tenderness              Adnexa: no mass, fullness, tenderness               Rectovaginal: Confirms               Anus:  normal sphincter tone, no lesions  Gae Dry, CMA chaperoned for the exam.  1. Well woman exam Discussed breast self exam Discussed calcium and vit D intake Mammogram due in 4/24 She will get her colonoscopy and labs with her primary  2. Screening for cervical cancer She wants it yearly - Cytology - PAP

## 2022-04-19 ENCOUNTER — Ambulatory Visit (INDEPENDENT_AMBULATORY_CARE_PROVIDER_SITE_OTHER): Payer: 59 | Admitting: Obstetrics and Gynecology

## 2022-04-19 ENCOUNTER — Encounter: Payer: Self-pay | Admitting: Obstetrics and Gynecology

## 2022-04-19 ENCOUNTER — Other Ambulatory Visit (HOSPITAL_COMMUNITY)
Admission: RE | Admit: 2022-04-19 | Discharge: 2022-04-19 | Disposition: A | Discharge status: Home / Self Care | Payer: 59 | Source: Ambulatory Visit | Attending: Obstetrics and Gynecology | Admitting: Obstetrics and Gynecology

## 2022-04-19 VITALS — BP 122/74 | HR 64 | Ht 63.5 in | Wt 203.0 lb

## 2022-04-19 DIAGNOSIS — Z124 Encounter for screening for malignant neoplasm of cervix: Secondary | ICD-10-CM

## 2022-04-19 DIAGNOSIS — Z01419 Encounter for gynecological examination (general) (routine) without abnormal findings: Secondary | ICD-10-CM | POA: Diagnosis not present

## 2022-04-19 NOTE — Patient Instructions (Signed)

## 2022-04-25 LAB — CYTOLOGY - PAP: Diagnosis: NEGATIVE

## 2023-04-13 ENCOUNTER — Other Ambulatory Visit: Payer: Self-pay

## 2023-05-02 ENCOUNTER — Telehealth: Payer: Self-pay | Admitting: *Deleted

## 2023-05-02 ENCOUNTER — Other Ambulatory Visit (HOSPITAL_COMMUNITY)
Admission: RE | Admit: 2023-05-02 | Discharge: 2023-05-02 | Disposition: A | Discharge status: Home / Self Care | Source: Ambulatory Visit | Attending: Radiology | Admitting: Radiology

## 2023-05-02 ENCOUNTER — Ambulatory Visit (INDEPENDENT_AMBULATORY_CARE_PROVIDER_SITE_OTHER): Payer: 59 | Admitting: Radiology

## 2023-05-02 ENCOUNTER — Encounter: Payer: Self-pay | Admitting: Radiology

## 2023-05-02 VITALS — BP 118/76 | HR 72 | Ht 63.25 in | Wt 205.4 lb

## 2023-05-02 DIAGNOSIS — Z01419 Encounter for gynecological examination (general) (routine) without abnormal findings: Secondary | ICD-10-CM | POA: Diagnosis present

## 2023-05-02 DIAGNOSIS — Z1331 Encounter for screening for depression: Secondary | ICD-10-CM | POA: Diagnosis not present

## 2023-05-02 DIAGNOSIS — Z8041 Family history of malignant neoplasm of ovary: Secondary | ICD-10-CM | POA: Diagnosis not present

## 2023-05-02 DIAGNOSIS — R350 Frequency of micturition: Secondary | ICD-10-CM

## 2023-05-02 DIAGNOSIS — Z13820 Encounter for screening for osteoporosis: Secondary | ICD-10-CM

## 2023-05-02 LAB — URINALYSIS, COMPLETE W/RFL CULTURE
Bacteria, UA: NONE SEEN /HPF
Bilirubin Urine: NEGATIVE
Glucose, UA: NEGATIVE
Hgb urine dipstick: NEGATIVE
Hyaline Cast: NONE SEEN /LPF
Ketones, ur: NEGATIVE
Leukocyte Esterase: NEGATIVE
Nitrites, Initial: NEGATIVE
Protein, ur: NEGATIVE
RBC / HPF: NONE SEEN /HPF (ref 0–2)
Specific Gravity, Urine: 1.01 (ref 1.001–1.035)
WBC, UA: NONE SEEN /HPF (ref 0–5)
pH: 7 (ref 5.0–8.0)

## 2023-05-02 LAB — NO CULTURE INDICATED
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
## 2023-05-03 LAB — CYTOLOGY - PAP
Comment: NEGATIVE
Diagnosis: NEGATIVE
High risk HPV: NEGATIVE

## 2023-05-30 ENCOUNTER — Ambulatory Visit: Admitting: Radiology

## 2023-06-16 ENCOUNTER — Ambulatory Visit: Admitting: Radiology

## 2023-07-24 ENCOUNTER — Ambulatory Visit (HOSPITAL_BASED_OUTPATIENT_CLINIC_OR_DEPARTMENT_OTHER): Admitting: Radiology

## 2023-07-24 ENCOUNTER — Encounter (HOSPITAL_BASED_OUTPATIENT_CLINIC_OR_DEPARTMENT_OTHER): Payer: Self-pay

## 2023-07-24 ENCOUNTER — Ambulatory Visit (HOSPITAL_BASED_OUTPATIENT_CLINIC_OR_DEPARTMENT_OTHER)
Admission: EM | Admit: 2023-07-24 | Discharge: 2023-07-24 | Disposition: A | Discharge status: Home / Self Care | Attending: Family Medicine | Admitting: Family Medicine

## 2023-07-24 DIAGNOSIS — R102 Pelvic and perineal pain: Secondary | ICD-10-CM

## 2023-07-24 LAB — POCT URINALYSIS DIP (MANUAL ENTRY)
Bilirubin, UA: NEGATIVE
Glucose, UA: NEGATIVE mg/dL
Ketones, POC UA: NEGATIVE mg/dL
Leukocytes, UA: NEGATIVE
Nitrite, UA: NEGATIVE
Protein Ur, POC: NEGATIVE mg/dL
Spec Grav, UA: 1.01 (ref 1.010–1.025)
Urobilinogen, UA: 0.2 U/dL
pH, UA: 6 (ref 5.0–8.0)

## 2023-07-24 NOTE — Discharge Instructions (Signed)
 Your urinalysis and were normal.  Go ahead and proceed with the treatment for bacterial vaginosis at this time.  You can take ibuprofen for pain as needed and follow-up with your doctor as needed

## 2023-07-24 NOTE — ED Triage Notes (Signed)
 States onset of low abd pain, urinary frequency today after getting out of bathtub. Patient states following her evaluation at [facility], she believes something more severe is wrong. States pain is 10/10.
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
## 2023-08-02 ENCOUNTER — Telehealth (HOSPITAL_COMMUNITY): Payer: Self-pay

## 2023-08-02 NOTE — Telephone Encounter (Signed)
 Pt left voicemail regarding 6/16 visit, stating she did not receive updates on results. States she not feeling any better.   No labs appear to have been sent, and per provider note, pt was made aware of u/s findings.  Do you advise pt return for re-eval? Thanks

## 2023-08-05 ENCOUNTER — Encounter (HOSPITAL_BASED_OUTPATIENT_CLINIC_OR_DEPARTMENT_OTHER): Payer: Self-pay

## 2023-08-05 ENCOUNTER — Inpatient Hospital Stay (HOSPITAL_BASED_OUTPATIENT_CLINIC_OR_DEPARTMENT_OTHER)
Admission: RE | Admit: 2023-08-05 | Discharge: 2023-08-05 | Discharge status: Home / Self Care | Source: Ambulatory Visit | Attending: Family Medicine | Admitting: Family Medicine

## 2023-08-05 VITALS — BP 133/91 | HR 98 | Temp 98.0°F | Resp 18

## 2023-08-05 DIAGNOSIS — M545 Low back pain, unspecified: Secondary | ICD-10-CM | POA: Diagnosis not present

## 2023-08-05 DIAGNOSIS — R350 Frequency of micturition: Secondary | ICD-10-CM | POA: Diagnosis not present

## 2023-08-05 DIAGNOSIS — B3731 Acute candidiasis of vulva and vagina: Secondary | ICD-10-CM | POA: Diagnosis not present

## 2023-08-05 LAB — POCT URINALYSIS DIP (MANUAL ENTRY)
Bilirubin, UA: NEGATIVE
Blood, UA: NEGATIVE
Glucose, UA: NEGATIVE mg/dL
Ketones, POC UA: NEGATIVE mg/dL
Leukocytes, UA: NEGATIVE
Nitrite, UA: NEGATIVE
Protein Ur, POC: NEGATIVE mg/dL
Spec Grav, UA: 1.02 (ref 1.010–1.025)
Urobilinogen, UA: 0.2 U/dL
pH, UA: 6.5 (ref 5.0–8.0)

## 2023-08-05 MED ORDER — FLUCONAZOLE 150 MG PO TABS: ORAL_TABLET | ORAL | 0 refills | Status: DC
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
## 2023-08-05 NOTE — ED Triage Notes (Signed)
 Pt c/o back and lower abdominal pain since the first of the month, she also feels fatigued. Pt would like a urine test to be done to rule out UTI.
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
## 2024-01-03 ENCOUNTER — Ambulatory Visit: Admitting: Obstetrics and Gynecology

## 2024-01-03 ENCOUNTER — Encounter: Payer: Self-pay | Admitting: Obstetrics and Gynecology

## 2024-01-03 VITALS — BP 118/76 | HR 77

## 2024-01-03 DIAGNOSIS — R350 Frequency of micturition: Secondary | ICD-10-CM

## 2024-01-03 DIAGNOSIS — N760 Acute vaginitis: Secondary | ICD-10-CM

## 2024-01-03 LAB — WET PREP FOR TRICH, YEAST, CLUE

## 2024-01-03 MED ORDER — NYSTATIN-TRIAMCINOLONE 100000-0.1 UNIT/GM-% EX OINT: 1.0000 | TOPICAL_OINTMENT | Freq: Two times a day (BID) | CUTANEOUS | 0 refills | Status: DC

## 2024-01-03 MED ORDER — SULFAMETHOXAZOLE-TRIMETHOPRIM 800-160 MG PO TABS: 1.0000 | ORAL_TABLET | Freq: Two times a day (BID) | ORAL | 0 refills | Status: DC

## 2024-01-03 NOTE — Progress Notes (Signed)
 GYNECOLOGY  VISIT   HPI: 63 y.o.   Married  Caucasian female   G2P2001 with No LMP recorded. Patient has had an ablation.   here for: Burning externally and internally, itching, tan discharge, odor. Has been happening for almost 1 week.     No OTC treatment.     Wears a pad constantly due to discharge.  No urinary incontinence.   No recent abx.    States hx of UTIs.    No vaginal bleeding.       Has DM.    Voiding often.    GYNECOLOGIC HISTORY: No LMP recorded. Patient has had an ablation. Contraception:  ablation, tubal ligation Menopausal hormone therapy:  n/a Last 2 paps:  05/02/23 neg HR HPV neg, 04/19/22 neg  History of abnormal Pap or positive HPV:  no Mammogram:  11/16/23 L Breast - BIRADS Cat 1 neg (care everywhere)          OB History     Gravida  2   Para  2   Term  2   Preterm      AB      Living  1      SAB      IAB      Ectopic      Multiple      Live Births  1              Patient Active Problem List   Diagnosis Date Noted   Obstructive sleep apnea 10/24/2019   Other chronic sinusitis 10/24/2019   Somatic dysfunction of right sacroiliac joint 09/15/2018   Genetic testing 08/14/2018   Family history of ovarian cancer    Family history of colon cancer    Family history of prostate cancer    Acquired short Achilles tendon of right lower extremity 11/22/2017   Posterior calcaneal exostosis 11/22/2017   Pain in right foot 08/11/2017   Tendonitis, Achilles, right 07/20/2017   Low back pain 07/13/2017   Injury of right ankle 06/16/2017   Ankle pain 06/16/2017   Strain of right Achilles tendon 06/16/2017   Adjustment insomnia 09/18/2015   Lumbar degenerative disc disease 05/05/2015   High risk medication use 04/21/2015   Impaired glucose tolerance 04/20/2015   Mixed hyperlipidemia 04/20/2015   Recurrent major depressive disorder, in remission 04/20/2015   Vitamin D deficiency 04/20/2015   Burn scar contracture of upper arm  01/20/2015    Past Medical History:  Diagnosis Date   Anxiety    Depression    Diabetes mellitus without complication (HCC)    no meds   Family history of colon cancer    Family history of ovarian cancer    Family history of prostate cancer    History of uterine fibroid    Sleep apnea    does not use CPAP    Past Surgical History:  Procedure Laterality Date   COLONOSCOPY  2007   in [place], [place]-polyps per pt   COLONOSCOPY     in [place], [place]-normal exam   ENDOMETRIAL ABLATION     GYNECOLOGIC CRYOSURGERY     NASAL SINUS SURGERY     SKIN GRAFT     TUBAL LIGATION     TUMOR REMOVAL     on thigh, benign per patient    Current Outpatient Medications  Medication Sig Dispense Refill   OZEMPIC, 1 MG/DOSE, 4 MG/3ML SOPN Inject 1 mg into the skin once a week.     alendronate (FOSAMAX) 70 MG  tablet Take 70 mg by mouth once a week. (Patient not taking: Reported on 01/03/2024)     Cholecalciferol 1.25 MG (50000 UT) capsule Take 1 capsule by mouth once a week. (Patient not taking: Reported on 01/03/2024)     Continuous Glucose Sensor (DEXCOM G7 SENSOR) MISC USE AS DIRECTED AND CHANGE EVERY 10 DAYS (Patient not taking: Reported on 01/03/2024)     fluconazole  (DIFLUCAN ) 150 MG tablet By mouth today and repeat in 5-7 days for vaginal yeast infection. (Patient not taking: Reported on 01/03/2024) 2 tablet 0   LORazepam (ATIVAN) 0.5 MG tablet Take 0.5 mg by mouth every 8 (eight) hours as needed for anxiety. (Patient not taking: Reported on 01/03/2024)     Olopatadine-Mometasone (RYALTRIS ) 665-25 MCG/ACT SUSP two sprays per nostril 1-2 times daily as needed (Patient not taking: Reported on 01/03/2024) 29 g 5   triamcinolone  cream (KENALOG ) 0.1 % Apply topically 2 (two) times daily. (Patient not taking: Reported on 01/03/2024)     No current facility-administered medications for this visit.     ALLERGIES: Tramadol  Family History  Problem Relation Age of Onset   Allergic rhinitis Mother     COPD Mother    Osteoporosis Mother    Heart attack Father    Ovarian cancer Sister        dx 61s   Prostate cancer Brother    Heart attack Brother    Cancer Brother        back cancer, sinus cancer and prostate cancer   Cancer Maternal Grandfather        unk type   Colon cancer Paternal Grandfather    Colon polyps Neg Hx    Esophageal cancer Neg Hx    Rectal cancer Neg Hx    Stomach cancer Neg Hx     Social History   Socioeconomic History   Marital status: Married    Spouse name: Not on file   Number of children: Not on file   Years of education: Not on file   Highest education level: Not on file  Occupational History   Not on file  Tobacco Use   Smoking status: Never    Passive exposure: Past   Smokeless tobacco: Never  Vaping Use   Vaping status: Never Used  Substance and Sexual Activity   Alcohol use: Not Currently    Comment: teenager   Drug use: Never   Sexual activity: Not Currently    Partners: Male    Birth control/protection: Surgical, Post-menopausal    Comment: ablation, tubal ligation, menarche 63yo, sexual debut 63yo  Other Topics Concern   Not on file  Social History Narrative   Not on file   Social Drivers of Health   Financial Resource Strain: Not on file  Food Insecurity: Not on file  Transportation Needs: Not on file  Physical Activity: Not on file  Stress: Not on file  Social Connections: Not on file  Intimate Partner Violence: Not At Risk (04/07/2023)   Received from [facility]   HITS    Over the last 12 months how often did your partner physically hurt you?: Never    Over the last 12 months how often did your partner insult you or talk down to you?: Never    Over the last 12 months how often did your partner threaten you with physical harm?: Never    Over the last 12 months how often did your partner scream or curse at you?: Never    Review of  Systems  See HPI.   PHYSICAL EXAMINATION:   BP 118/76 (BP Location: Left Arm,  Patient Position: Sitting)   Pulse 77   SpO2 97%     General appearance: alert, cooperative and appears stated age   Pelvic: External genitalia: generalized erythema of the vulva.               Urethra:  normal appearing urethra with no masses, tenderness or lesions              Bartholins and Skenes: normal                 Vagina: normal appearing vagina with normal color and discharge, no lesions              Cervix: no lesions                Bimanual Exam:  Uterus:  normal size, contour, position, consistency, mobility, non-tender              Adnexa: no mass, fullness, tenderness       Chaperone was present for exam:  Kari HERO, CMA  ASSESSMENT:  Vulvovaginitis.  Urinary frequency.  DM.  Hx endometrial ablation.   PLAN:  Wet prep: neg yeast, neg clue cells, neg trichomonas.  Urinalysis:  sg 1.020, ph 7.0, 6 - 10 WBC, NS RBC, 6 - 10 squams, few bacteria.  UC sent. Bactrim  DS po bid x 3 days.  Mycology II to vulva twice daily for 7 days prn.  Switch to a cotton based pad or try to not use one.  FU prn.   25 total time was spent for this patient encounter, including preparation, face-to-face counseling with the patient, coordination of care, and documentation of the encounter.

## 2024-01-04 LAB — URINALYSIS, COMPLETE W/RFL CULTURE
Bilirubin Urine: NEGATIVE
Glucose, UA: NEGATIVE
Hgb urine dipstick: NEGATIVE
Hyaline Cast: NONE SEEN /LPF
Ketones, ur: NEGATIVE
Nitrites, Initial: NEGATIVE
Protein, ur: NEGATIVE
RBC / HPF: NONE SEEN /HPF (ref 0–2)
Specific Gravity, Urine: 1.02 (ref 1.001–1.035)
pH: 7 (ref 5.0–8.0)

## 2024-01-04 LAB — URINE CULTURE
MICRO NUMBER:: 17287107
SPECIMEN QUALITY:: ADEQUATE

## 2024-01-04 LAB — CULTURE INDICATED

## 2024-01-11 ENCOUNTER — Ambulatory Visit: Payer: Self-pay | Admitting: Obstetrics and Gynecology

## 2024-02-07 ENCOUNTER — Ambulatory Visit (HOSPITAL_BASED_OUTPATIENT_CLINIC_OR_DEPARTMENT_OTHER): Admission: EM | Admit: 2024-02-07 | Discharge: 2024-02-07 | Disposition: A | Discharge status: Home / Self Care

## 2024-02-07 ENCOUNTER — Other Ambulatory Visit (HOSPITAL_BASED_OUTPATIENT_CLINIC_OR_DEPARTMENT_OTHER): Payer: Self-pay

## 2024-02-07 ENCOUNTER — Encounter (HOSPITAL_BASED_OUTPATIENT_CLINIC_OR_DEPARTMENT_OTHER): Payer: Self-pay

## 2024-02-07 DIAGNOSIS — R52 Pain, unspecified: Secondary | ICD-10-CM | POA: Diagnosis not present

## 2024-02-07 DIAGNOSIS — R051 Acute cough: Secondary | ICD-10-CM

## 2024-02-07 DIAGNOSIS — J069 Acute upper respiratory infection, unspecified: Secondary | ICD-10-CM

## 2024-02-07 LAB — POCT INFLUENZA A/B
Influenza A, POC: NEGATIVE
Influenza B, POC: NEGATIVE

## 2024-02-07 MED ORDER — PROMETHAZINE-DM 6.25-15 MG/5ML PO SYRP
5.0000 mL | ORAL_SOLUTION | Freq: Four times a day (QID) | ORAL | 0 refills | Status: AC | PRN
  Filled 2024-02-07: qty 118, 6d supply, fill #0

## 2024-02-07 MED ORDER — OSELTAMIVIR PHOSPHATE 75 MG PO CAPS
ORAL_CAPSULE | ORAL | 0 refills | Status: AC
  Filled 2024-02-07: qty 10, 10d supply, fill #0

## 2024-02-07 NOTE — Discharge Instructions (Addendum)
 Viral upper respiratory infection with cough and bodyaches:   Negative for flu.  I believe patient might have the flu but it is too early for her to be ruled out with the test.    Encouraged to get a home test and test tomorrow.  If she is positive for the flu tomorrow, then she can start Tamiflu.  Provided Tamiflu printed prescription to get filled if needed.  She believes she has been exposed to the flu so she could take it as preventative therapy once daily.  If she is positive for the flu she should switch to taking it twice daily for 5 days.  Promethazine DM, 5 mL, every 6 hours if needed for cough.  Follow-up if symptoms do not improve, worsen or new symptoms occur.

## 2024-02-07 NOTE — ED Provider Notes (Addendum)
 " Nancy Lynch    CSN: [id number] Arrival date & time: 02/07/24  1411      History   Chief Complaint Chief Complaint  Patient presents with   Nasal Congestion    HPI Nancy Lynch is a 63 y.o. female.   63 year old female with complaint of nasal congestion, cough, facial pain, right ear pain, headache, blurred vision and bodyaches that started at approximately 9 AM this morning.  She reports that she has intermittent and chronic runny nose and allergies all the time but this was new and different.  She is concerned she has the flu.     Past Medical History:  Diagnosis Date   Anxiety    Depression    Diabetes mellitus without complication (HCC)    no meds   Family history of colon cancer    Family history of ovarian cancer    Family history of prostate cancer    History of uterine fibroid    Sleep apnea    does not use CPAP    Patient Active Problem List   Diagnosis Date Noted   Obstructive sleep apnea 10/24/2019   Other chronic sinusitis 10/24/2019   Somatic dysfunction of right sacroiliac joint 09/15/2018   Genetic testing 08/14/2018   Family history of ovarian cancer    Family history of colon cancer    Family history of prostate cancer    Acquired short Achilles tendon of right lower extremity 11/22/2017   Posterior calcaneal exostosis 11/22/2017   Pain in right foot 08/11/2017   Tendonitis, Achilles, right 07/20/2017   Low back pain 07/13/2017   Injury of right ankle 06/16/2017   Ankle pain 06/16/2017   Strain of right Achilles tendon 06/16/2017   Adjustment insomnia 09/18/2015   Lumbar degenerative disc disease 05/05/2015   High risk medication use 04/21/2015   Impaired glucose tolerance 04/20/2015   Mixed hyperlipidemia 04/20/2015   Recurrent major depressive disorder, in remission 04/20/2015   Vitamin D deficiency 04/20/2015   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 Authorizing Provider  oseltamivir (TAMIFLU) 75 MG capsule Take 1 capsule (75 mg) daily for 10 days for prevention of influenza after exposure.  If you test at home and are positive for influenza type A or B, switch to 75 mg twice daily for the active flu infection. 02/07/24  Yes Ival Domino, FNP  promethazine-dextromethorphan (PROMETHAZINE-DM) 6.25-15 MG/5ML syrup Take 5 mLs by mouth every 6 (six) hours as needed  for cough. Do not use and drive - May make drowsy. 02/07/24  Yes Ival Domino, FNP  OZEMPIC, 2 MG/DOSE, 8 MG/3ML SOPN Inject 2 mg into the skin once a week.    [provider]    Family History Family History  Problem Relation Age of Onset   Allergic rhinitis Mother    COPD Mother    Osteoporosis Mother    Heart attack Father    Ovarian cancer Sister        dx 63s   Prostate cancer Brother    Heart attack Brother    Cancer Brother        back cancer, sinus cancer and prostate cancer   Cancer Maternal Grandfather        unk type   Colon cancer Paternal Grandfather    Colon polyps Neg Hx    Esophageal cancer Neg Hx    Rectal cancer Neg Hx    Stomach cancer Neg Hx     Social History Social History[1]   Allergies   Tramadol   Review of Systems Review of Systems  Constitutional:  Positive for chills. Negative for fever.  HENT:  Positive for congestion, ear pain, postnasal drip, rhinorrhea, sinus pressure and sinus  pain. Negative for sore throat.   Eyes:  Negative for pain and visual disturbance.  Respiratory:  Positive for cough. Negative for shortness of breath.   Cardiovascular:  Negative for chest pain and palpitations.  Gastrointestinal:  Negative for abdominal pain, constipation, diarrhea, nausea and vomiting.  Genitourinary:  Negative for dysuria and hematuria.  Musculoskeletal:  Positive for arthralgias. Negative for back pain.  Skin:  Negative for color change and rash.  Neurological:  Positive for headaches. Negative for seizures and syncope.  All other systems reviewed and are negative.    Physical Exam Triage Vital Signs ED Triage Vitals  Encounter Vitals Group     BP 02/07/24 1446 139/81     Girls Systolic BP Percentile --      Girls Diastolic BP Percentile --      Boys Systolic BP Percentile --      Boys Diastolic BP Percentile --      Pulse Rate 02/07/24 1446 72     Resp 02/07/24 1446 20     Temp 02/07/24 1446 98.1 F (36.7 C)     Temp Source 02/07/24 1446 Oral     SpO2 02/07/24 1446 97 %     Weight --      Height --      Head Circumference --      Peak Flow --      Pain Score 02/07/24 1441 8     Pain Loc --      Pain Education --      Exclude from Growth Chart --    No data found.  Updated Vital Signs BP 139/81 (BP Location: Right Arm)   Pulse 72   Temp 98.1 F (36.7 C) (Oral)   Resp 20   SpO2 97%   Visual Acuity Right Eye Distance:   Left Eye Distance:   Bilateral Distance:    Right Eye Near:   Left Eye Near:    Bilateral Near:     Physical Exam Vitals and nursing note reviewed.  Constitutional:      General: She is not in acute distress.    Appearance: She is well-developed. She is not ill-appearing, toxic-appearing or diaphoretic.  HENT:     Head: Normocephalic and atraumatic.  Right Ear: Hearing, tympanic membrane, ear canal and external ear normal.     Left Ear: Hearing, tympanic membrane, ear canal and external ear normal.     Nose:  Congestion and rhinorrhea present. Rhinorrhea is clear.     Right Sinus: No maxillary sinus tenderness or frontal sinus tenderness.     Left Sinus: No maxillary sinus tenderness or frontal sinus tenderness.     Mouth/Throat:     Lips: Pink.     Mouth: Mucous membranes are moist.     Pharynx: Uvula midline. No oropharyngeal exudate or posterior oropharyngeal erythema.     Tonsils: No tonsillar exudate.  Eyes:     Conjunctiva/sclera: Conjunctivae normal.     Pupils: Pupils are equal, round, and reactive to light.  Cardiovascular:     Rate and Rhythm: Normal rate and regular rhythm.     Heart sounds: S1 normal and S2 normal. No murmur heard. Pulmonary:     Effort: Pulmonary effort is normal. No respiratory distress.     Breath sounds: Normal breath sounds. No decreased breath sounds, wheezing, rhonchi or rales.  Abdominal:     General: Bowel sounds are normal.     Palpations: Abdomen is soft.     Tenderness: There is no abdominal tenderness.  Musculoskeletal:        General: No swelling.     Cervical back: Neck supple.  Lymphadenopathy:     Head:     Right side of head: No submental, submandibular, tonsillar, preauricular or posterior auricular adenopathy.     Left side of head: No submental, submandibular, tonsillar, preauricular or posterior auricular adenopathy.     Cervical: Cervical adenopathy present.     Right cervical: Superficial cervical adenopathy present.     Left cervical: Superficial cervical adenopathy present.  Skin:    General: Skin is warm and dry.     Capillary Refill: Capillary refill takes less than 2 seconds.     Findings: No rash.  Neurological:     Mental Status: She is alert and oriented to person, place, and time.  Psychiatric:        Mood and Affect: Mood normal.      UC Treatments / Results  Labs (all labs ordered are listed, but only abnormal results are displayed) Labs Reviewed  POCT INFLUENZA A/B - Normal    EKG   Radiology No results  found.  Procedures Procedures (including critical Lynch time)  Medications Ordered in UC Medications - No data to display  Initial Impression / Assessment and Plan / UC Course  I have reviewed the triage vital signs and the nursing notes.  Pertinent labs & imaging results that were available during my Lynch of the patient were reviewed by me and considered in my medical decision making (see chart for details).  Plan of Lynch (see discharge instructions for additional patient precautions and education): Viral upper respiratory infection with cough and bodyaches:   Negative for flu.  I believe patient might have the flu but it is too early for her to be ruled out with the test.    Encouraged to get a home test and test tomorrow.  Tamiflu 75 mg daily for 10 days for flu prevention after exposure.  If her home flu test is positive for the flu she should switch to 75 mg twice daily for 5 days.  Promethazine DM, 5 mL, every 6 hours if needed for cough.  Follow-up if symptoms do not improve, worsen or new symptoms occur.  I reviewed the plan of Lynch with the patient and/or the patient's guardian.  The patient and/or guardian had time to ask questions and acknowledged that the questions were answered.  Final Clinical Impressions(s) / UC Diagnoses   Final diagnoses:  Acute cough  Generalized body aches  Viral URI with cough     Discharge Instructions      Viral upper respiratory infection with cough and bodyaches:   Negative for flu.  I believe patient might have the flu but it is too early for her to be ruled out with the test.    Encouraged to get a home test and test tomorrow.  If she is positive for the flu tomorrow, then she can start Tamiflu.  Provided Tamiflu printed prescription to get filled if needed.  She believes she has been exposed to the flu so she could take it as preventative therapy once daily.  If she is positive for the flu she should switch to taking it twice daily  for 5 days.  Promethazine DM, 5 mL, every 6 hours if needed for cough.  Follow-up if symptoms do not improve, worsen or new symptoms occur.     ED Prescriptions     Medication Sig Dispense Auth. Provider   promethazine-dextromethorphan (PROMETHAZINE-DM) 6.25-15 MG/5ML syrup Take 5 mLs by mouth every 6 (six) hours as needed for cough. Do not use and drive - May make drowsy. 118 mL Ival Domino, FNP   oseltamivir (TAMIFLU) 75 MG capsule Take 1 capsule (75 mg) daily for 10 days for prevention of influenza after exposure.  If you test at home and are positive for influenza type A or B, switch to 75 mg twice daily for the active flu infection. 10 capsule Ival Domino, FNP      PDMP not reviewed this encounter.    Ival Domino, FNP 02/07/24 1556     [1]  Social History Tobacco Use   Smoking status: Never    Passive exposure: Past   Smokeless tobacco: Never  Vaping Use   Vaping status: Never Used  Substance Use Topics   Alcohol use: Not Currently    Comment: teenager   Drug use: Never     Ival Domino, FNP 02/07/24 8397  "

## 2024-02-07 NOTE — ED Triage Notes (Signed)
 Pt c/o nasal congestion, facial pain, right ear pain, HA, blurry vision starting today. Pt has not taken anything for her symptoms today.

## 2024-09-03 ENCOUNTER — Ambulatory Visit: Admitting: Obstetrics and Gynecology
